# Patient Record
Sex: Female | Born: 1985 | Race: White | Hispanic: No | Smoking: Former smoker
Health system: Southern US, Community
[De-identification: ages and names within clinical notes are randomized; demographics above are authoritative.]

## PROBLEM LIST (undated history)

## (undated) DIAGNOSIS — K219 Gastro-esophageal reflux disease without esophagitis: Secondary | ICD-10-CM

## (undated) DIAGNOSIS — F439 Reaction to severe stress, unspecified: Secondary | ICD-10-CM

## (undated) DIAGNOSIS — F419 Anxiety disorder, unspecified: Secondary | ICD-10-CM

## (undated) DIAGNOSIS — Z862 Personal history of diseases of the blood and blood-forming organs and certain disorders involving the immune mechanism: Secondary | ICD-10-CM

## (undated) HISTORY — DX: Reaction to severe stress, unspecified: F43.9

---

## 2011-05-25 ENCOUNTER — Encounter (HOSPITAL_COMMUNITY): Payer: Self-pay

## 2011-05-25 ENCOUNTER — Emergency Department (HOSPITAL_COMMUNITY)
Admission: EM | Admit: 2011-05-25 | Discharge: 2011-05-25 | Disposition: A | Discharge status: Home / Self Care | Payer: Self-pay | Attending: Emergency Medicine | Admitting: Emergency Medicine

## 2011-05-25 DIAGNOSIS — L03319 Cellulitis of trunk, unspecified: Secondary | ICD-10-CM | POA: Insufficient documentation

## 2011-05-25 DIAGNOSIS — R000 Tachycardia, unspecified: Secondary | ICD-10-CM | POA: Insufficient documentation

## 2011-05-25 DIAGNOSIS — R109 Unspecified abdominal pain: Secondary | ICD-10-CM | POA: Insufficient documentation

## 2011-05-25 DIAGNOSIS — O909 Complication of the puerperium, unspecified: Secondary | ICD-10-CM | POA: Insufficient documentation

## 2011-05-25 DIAGNOSIS — L02219 Cutaneous abscess of trunk, unspecified: Secondary | ICD-10-CM | POA: Insufficient documentation

## 2011-05-25 DIAGNOSIS — L0291 Cutaneous abscess, unspecified: Secondary | ICD-10-CM

## 2011-05-25 MED ORDER — DOXYCYCLINE HYCLATE 100 MG PO CAPS: 100.0000 mg | ORAL_CAPSULE | Freq: Two times a day (BID) | ORAL | Status: AC

## 2011-05-25 MED ORDER — HYDROCODONE-ACETAMINOPHEN 5-325 MG PO TABS: 1.0000 | ORAL_TABLET | Freq: Four times a day (QID) | ORAL | Status: AC | PRN

## 2011-05-25 NOTE — ED Notes (Addendum)
Pt presents with spot on c-section scar that is warm and extremely painful since last night. Pt states spot was draining last night.

## 2011-05-25 NOTE — ED Provider Notes (Signed)
History     CSN: [id number]  Arrival date & time 05/25/11  1702   First MD Initiated Contact with Patient 05/25/11 1817      Chief Complaint  Patient presents with  . Wound Infection    (Consider location/radiation/quality/duration/timing/severity/associated sxs/prior treatment) The history is provided by the patient and the spouse.  patient is a 26 year old female status post C-section a few months ago for the delivery of her most recent child. Starting last evening that area got along the scar of the C-section Painful started draining a purulent material. No fever no nausea no vomiting. He states the pain is 10 out of 10. Feels sharp and tender in nature. No history of any problem with the C-section scar until now.  History reviewed. No pertinent past medical history.  Past Surgical History  Procedure Date  . Cesarean section     No family history on file.  History  Substance Use Topics  . Smoking status: Current Everyday Smoker -- 0.5 packs/day  . Smokeless tobacco: Not on file  . Alcohol Use: No    OB History    Grav Para Term Preterm Abortions TAB SAB Ect Mult Living                  Review of Systems  Constitutional: Negative for fever.  HENT: Negative for congestion, neck pain and neck stiffness.   Eyes: Negative for redness.  Respiratory: Negative for shortness of breath.   Cardiovascular: Negative for chest pain.  Gastrointestinal: Positive for abdominal pain. Negative for nausea, vomiting and diarrhea.  Genitourinary: Negative for dysuria.  Musculoskeletal: Negative for back pain.  Skin: Positive for wound. Negative for rash.  Neurological: Negative for headaches.  Hematological: Does not bruise/bleed easily.    Allergies  Penicillins  Home Medications   Current Outpatient Rx  Name Route Sig Dispense Refill  . DOXYCYCLINE HYCLATE 100 MG PO CAPS Oral Take 1 capsule (100 mg total) by mouth 2 (two) times daily. 14 capsule 0  .  HYDROCODONE-ACETAMINOPHEN 5-325 MG PO TABS Oral Take 1-2 tablets by mouth every 6 (six) hours as needed for pain. 10 tablet 0    BP 123/94  Pulse 128  Temp(Src) 98.2 F (36.8 C) (Oral)  Resp 20  Ht 5\' 1"  (1.549 m)  Wt 109 lb (49.442 kg)  BMI 20.60 kg/m2  SpO2 100%  Physical Exam  Nursing note and vitals reviewed. Constitutional: She is oriented to person, place, and time. She appears well-developed and well-nourished. No distress.  HENT:  Head: Normocephalic and atraumatic.  Mouth/Throat: Oropharynx is clear and moist.  Eyes: Conjunctivae and EOM are normal. Pupils are equal, round, and reactive to light.  Neck: Normal range of motion. Neck supple.  Cardiovascular: Normal rate, regular rhythm and normal heart sounds.   No murmur heard.      tachycardic  Pulmonary/Chest: Effort normal and breath sounds normal. No respiratory distress.  Abdominal: Soft. Bowel sounds are normal. There is tenderness.       Normal except for C-section scar which is well-healed except for in the center of is about a 5 mm area of erythema induration with a head of purulent material was sudden slight discharge. No significant abdominal surrounding tenderness or erythema no major area of fluctuance.  Musculoskeletal: Normal range of motion.  Neurological: She is alert and oriented to person, place, and time. No cranial nerve deficit. She exhibits normal muscle tone. Coordination normal.  Skin: Skin is warm. There is erythema.  ED Course  Procedures (including critical care time)  Labs Reviewed - No data to display No results found.   1. Abscess       MDM   Patient status post C-section for delivery of her most recent child a few months ago. During last night patient noticed a bump redness and soreness along the scar started draining some purulent material last night. Examination today shows about a 5 mm small abscess along the scar of the C-section was small had a purulent material about 5 mm  of induration no distinct fluctuance minimal erythema really not spent spreading beyond 5 mm.  We'll treat her doxycycline. She will return if the abscess gets bigger or worse it is already draining some purulent material suspect it will continue do that and he'll final stone with some antibiotic support.        Shelda Jakes, MD 05/25/11 2010

## 2011-07-10 ENCOUNTER — Encounter (HOSPITAL_COMMUNITY): Payer: Self-pay | Admitting: *Deleted

## 2011-07-10 ENCOUNTER — Emergency Department (HOSPITAL_COMMUNITY)
Admission: EM | Admit: 2011-07-10 | Discharge: 2011-07-10 | Disposition: A | Discharge status: Home / Self Care | Payer: Self-pay | Attending: Emergency Medicine | Admitting: Emergency Medicine

## 2011-07-10 DIAGNOSIS — J039 Acute tonsillitis, unspecified: Secondary | ICD-10-CM | POA: Insufficient documentation

## 2011-07-10 DIAGNOSIS — F17200 Nicotine dependence, unspecified, uncomplicated: Secondary | ICD-10-CM | POA: Insufficient documentation

## 2011-07-10 DIAGNOSIS — Z880 Allergy status to penicillin: Secondary | ICD-10-CM | POA: Insufficient documentation

## 2011-07-10 LAB — RAPID STREP SCREEN (MED CTR MEBANE ONLY): Streptococcus, Group A Screen (Direct): NEGATIVE

## 2011-07-10 MED ORDER — IBUPROFEN 600 MG PO TABS: 600.0000 mg | ORAL_TABLET | Freq: Four times a day (QID) | ORAL | Status: AC | PRN

## 2011-07-10 MED ORDER — IBUPROFEN 800 MG PO TABS
800.0000 mg | ORAL_TABLET | Freq: Once | ORAL | Status: AC
  Administered 2011-07-10: 800 mg via ORAL
  Filled 2011-07-10: qty 1

## 2011-07-10 MED ORDER — AZITHROMYCIN 250 MG PO TABS: 250.0000 mg | ORAL_TABLET | Freq: Every day | ORAL | Status: AC

## 2011-07-10 MED ORDER — AZITHROMYCIN 250 MG PO TABS
500.0000 mg | ORAL_TABLET | Freq: Once | ORAL | Status: AC
  Administered 2011-07-10: 500 mg via ORAL
  Filled 2011-07-10: qty 2

## 2011-07-10 NOTE — ED Notes (Signed)
Pt presents to Ed with c/o bilateral earaches. Pt's throat and tonsils are swollen/redened. Throat cultured by EDP. Sample sent to lab. Results pending. Pt denies fever/cough, and URI symptoms.

## 2011-07-10 NOTE — ED Provider Notes (Signed)
Medical screening examination/treatment/procedure(s) were performed by non-physician practitioner and as supervising physician I was immediately available for consultation/collaboration.   Dameion Briles, MD 07/10/11 2211 

## 2011-07-10 NOTE — ED Notes (Signed)
Bilateral ear ache

## 2011-07-10 NOTE — Discharge Instructions (Signed)
Tonsillitis Tonsils are lumps of lymphoid tissues at the back of the throat. Each tonsil has 20 crevices (crypts). Tonsils help fight nose and throat infections and keep infection from spreading to other parts of the body for the first 18 months of life. Tonsillitis is an infection of the throat that causes the tonsils to become red, tender, and swollen. CAUSES Sudden and, if treated, temporary (acute) tonsillitis is usually caused by infection with streptococcal bacteria. Long lasting (chronic) tonsillitis occurs when the crypts of the tonsils become filled with pieces of food and bacteria, which makes it easy for the tonsils to become constantly infected. SYMPTOMS  Symptoms of tonsillitis include:  A sore throat.   White patches on the tonsils.   Fever.   Tiredness.  DIAGNOSIS Tonsillitis can be diagnosed through a physical exam. Diagnosis can be confirmed with the results of lab tests, including a throat culture. TREATMENT  The goals of tonsillitis treatment include the reduction of the severity and duration of symptoms, prevention of associated conditions, and prevention of disease transmission. Tonsillitis caused by bacteria can be treated with antibiotics. Usually, treatment with antibiotics is started before the cause of the tonsillitis is known. However, if it is determined that the cause is not bacterial, antibiotics will not treat the tonsillitis. If attacks of tonsillitis are severe and frequent, your caregiver may recommend surgery to remove the tonsils (tonsillectomy). HOME CARE INSTRUCTIONS   Rest as much as possible and get plenty of sleep.   Drink plenty of fluids. While the throat is very sore, eat soft foods or liquids, such as sherbet, soups, or instant breakfast drinks.   Eat frozen ice pops.   Older children and adults may gargle with a warm or cold liquid to help soothe the throat. Mix 1 teaspoon of salt in 1 cup of water.   Other family members who also develop a  sore throat or fever should have a medical exam or throat culture.   Only take over-the-counter or prescription medicines for pain, discomfort, or fever as directed by your caregiver.   If you are given antibiotics, take them as directed. Finish them even if you start to feel better.  SEEK MEDICAL CARE IF:   Your baby is older than 3 months with a rectal temperature of 100.5 F (38.1 C) or higher for more than 1 day.   Large, tender lumps develop in your neck.   A rash develops.   Green, yellow-brown, or bloody substance is coughed up.   You are unable to swallow liquids or food for 24 hours.   Your child is unable to swallow food or liquids for 12 hours.  SEEK IMMEDIATE MEDICAL CARE IF:   You develop any new symptoms such as vomiting, severe headache, stiff neck, chest pain, or trouble breathing or swallowing.   You have severe throat pain along with drooling or voice changes.   You have severe pain, unrelieved with recommended medications.   You are unable to fully open the mouth.   You develop redness, swelling, or severe pain anywhere in the neck.   You have a fever.   Your baby is older than 3 months with a rectal temperature of 102 F (38.9 C) or higher.   Your baby is 12 months old or younger with a rectal temperature of 100.4 F (38 C) or higher.  MAKE SURE YOU:   Understand these instructions.   Will watch your condition.   Will get help right away if you are not  watch your condition.   Will get help right away if you are not doing well or get worse.  Document Released: 01/30/2005 Document Revised: 04/11/2011 Document Reviewed: 06/28/2010  [employer] Patient Information 2012 [employer].

## 2011-07-10 NOTE — ED Provider Notes (Signed)
History     CSN: [id number]  Arrival date & time 07/10/11  2058   First MD Initiated Contact with Patient 07/10/11 2104      Chief Complaint  Patient presents with  . Otalgia    (Consider location/radiation/quality/duration/timing/severity/associated sxs/prior treatment) Patient is a 25 y.o. female presenting with ear pain. The history is provided by the patient.  Otalgia This is a new problem. The current episode started more than 2 days ago. There is pain in both (Pain started in her right ear,  is also hurting in her right ear.  She denies drainage,  denies fever.) ears. The problem occurs constantly. The problem has been gradually worsening. There has been no fever. The pain is at a severity of 9/10. The pain is severe. Associated symptoms include sore throat. Pertinent negatives include no ear discharge, no headaches, no hearing loss, no rhinorrhea, no abdominal pain, no neck pain, no cough and no rash. Her past medical history does not include chronic ear infection.    History reviewed. No pertinent past medical history.  Past Surgical History  Procedure Date  . Cesarean section     No family history on file.  History  Substance Use Topics  . Smoking status: Current Everyday Smoker -- 0.5 packs/day  . Smokeless tobacco: Not on file  . Alcohol Use: No    OB History    Grav Para Term Preterm Abortions TAB SAB Ect Mult Living                  Review of Systems  Constitutional: Negative for fever.  HENT: Positive for ear pain and sore throat. Negative for hearing loss, congestion, rhinorrhea, neck pain and ear discharge.   Eyes: Negative.   Respiratory: Negative for cough, chest tightness and shortness of breath.   Cardiovascular: Negative for chest pain and palpitations.  Gastrointestinal: Negative for nausea and abdominal pain.  Genitourinary: Negative.   Musculoskeletal: Negative for joint swelling and arthralgias.  Skin: Negative.  Negative for rash and wound.    Neurological: Negative for dizziness, weakness, light-headedness, numbness and headaches.  Hematological: Negative.   Psychiatric/Behavioral: Negative.     Allergies  Penicillins  Home Medications   Current Outpatient Rx  Name Route Sig Dispense Refill  . LEVONORGESTREL 20 MCG/24HR IU IUD Intrauterine 1 each by Intrauterine route once.    . AZITHROMYCIN 250 MG PO TABS Oral Take 1 tablet (250 mg total) by mouth daily. One tablet PO daily for 4 days 4 tablet 0  . IBUPROFEN 600 MG PO TABS Oral Take 1 tablet (600 mg total) by mouth every 6 (six) hours as needed for pain. 20 tablet 0    BP 132/89  Pulse 129  Temp(Src) 97.7 F (36.5 C) (Oral)  Resp 20  Ht 5\' 1"  (1.549 m)  Wt 115 lb (52.164 kg)  BMI 21.73 kg/m2  SpO2 100%  LMP 07/10/2011  Physical Exam  Nursing note and vitals reviewed. Constitutional: She is oriented to person, place, and time. She appears well-developed and well-nourished.  HENT:  Head: Normocephalic and atraumatic.  Right Ear: Tympanic membrane and external ear normal.  Left Ear: Tympanic membrane and external ear normal.  Nose: Nose normal.  Mouth/Throat: Uvula is midline and mucous membranes are normal. Posterior oropharyngeal erythema present. No oropharyngeal exudate, posterior oropharyngeal edema or tonsillar abscesses.       Moderate bilateral tonsillar hypertrophy,  Right greater than left,  But not touching uvula.  Eyes: Conjunctivae are normal.  Neck: Normal  range of motion. No mass and no thyromegaly present.  Cardiovascular: Normal rate, regular rhythm, normal heart sounds and intact distal pulses.   Pulmonary/Chest: Effort normal and breath sounds normal. She has no wheezes.  Abdominal: Soft. Bowel sounds are normal. There is no tenderness.  Musculoskeletal: Normal range of motion.  Lymphadenopathy:       Head (right side): Tonsillar adenopathy present.  Neurological: She is alert and oriented to person, place, and time.  Skin: Skin is warm  and dry.  Psychiatric: She has a normal mood and affect.    ED Course  Procedures (including critical care time)   Labs Reviewed  RAPID STREP SCREEN   No results found.   1. Tonsillitis       MDM  Zithromax,  Ibuprofen.  Recheck of pulse rate at dc 104.  Discussed elevated pulse rate.  Patient states her pulse is always elevated,  Generally stays around 100-110.  Denies sob,  Dizziness,  Palpitations.  Referral to Dr Ouida Sills to establish primary medical care.        Candis Musa, PA 07/10/11 2201  Candis Musa, PA 07/10/11 2202

## 2011-11-05 ENCOUNTER — Encounter (HOSPITAL_COMMUNITY): Payer: Self-pay | Admitting: *Deleted

## 2011-11-05 ENCOUNTER — Emergency Department (HOSPITAL_COMMUNITY)
Admission: EM | Admit: 2011-11-05 | Discharge: 2011-11-05 | Disposition: A | Discharge status: Home / Self Care | Payer: Self-pay | Attending: Emergency Medicine | Admitting: Emergency Medicine

## 2011-11-05 DIAGNOSIS — R1011 Right upper quadrant pain: Secondary | ICD-10-CM | POA: Insufficient documentation

## 2011-11-05 DIAGNOSIS — R109 Unspecified abdominal pain: Secondary | ICD-10-CM

## 2011-11-05 HISTORY — DX: Gastro-esophageal reflux disease without esophagitis: K21.9

## 2011-11-05 HISTORY — DX: Anxiety disorder, unspecified: F41.9

## 2011-11-05 LAB — CBC WITH DIFFERENTIAL/PLATELET
Basophils Absolute: 0 10*3/uL (ref 0.0–0.1)
Lymphocytes Relative: 39 % (ref 12–46)
Lymphs Abs: 2.1 10*3/uL (ref 0.7–4.0)
Neutro Abs: 2.8 10*3/uL (ref 1.7–7.7)
Neutrophils Relative %: 52 % (ref 43–77)
Platelets: 196 10*3/uL (ref 150–400)
RBC: 4.58 MIL/uL (ref 3.87–5.11)
RDW: 12.5 % (ref 11.5–15.5)
WBC: 5.4 10*3/uL (ref 4.0–10.5)

## 2011-11-05 LAB — PREGNANCY, URINE: Preg Test, Ur: NEGATIVE

## 2011-11-05 LAB — COMPREHENSIVE METABOLIC PANEL
ALT: 8 U/L (ref 0–35)
AST: 13 U/L (ref 0–37)
Alkaline Phosphatase: 54 U/L (ref 39–117)
CO2: 27 mEq/L (ref 19–32)
Calcium: 10.1 mg/dL (ref 8.4–10.5)
Chloride: 100 mEq/L (ref 96–112)
GFR calc non Af Amer: >90 mL/min (ref >90)
Potassium: 3.6 mEq/L (ref 3.5–5.1)
Sodium: 136 mEq/L (ref 135–145)
Total Bilirubin: 0.2 mg/dL — ABNORMAL LOW (ref 0.3–1.2)

## 2011-11-05 LAB — URINALYSIS, ROUTINE W REFLEX MICROSCOPIC
Hgb urine dipstick: NEGATIVE
Nitrite: NEGATIVE
Specific Gravity, Urine: 1.02 (ref 1.005–1.030)
Urobilinogen, UA: 0.2 mg/dL (ref 0.0–1.0)
pH: 6.5 (ref 5.0–8.0)

## 2011-11-05 MED ORDER — FAMOTIDINE IN NACL 20-0.9 MG/50ML-% IV SOLN
20.0000 mg | Freq: Once | INTRAVENOUS | Status: AC
  Administered 2011-11-05: 20 mg via INTRAVENOUS
  Filled 2011-11-05: qty 50

## 2011-11-05 MED ORDER — ONDANSETRON HCL 4 MG/2ML IJ SOLN
4.0000 mg | Freq: Once | INTRAMUSCULAR | Status: AC
  Administered 2011-11-05: 4 mg via INTRAVENOUS
  Filled 2011-11-05: qty 2

## 2011-11-05 MED ORDER — FAMOTIDINE 20 MG PO TABS: 20.0000 mg | ORAL_TABLET | Freq: Two times a day (BID) | ORAL | Status: DC

## 2011-11-05 MED ORDER — SODIUM CHLORIDE 0.9 % IV SOLN
Freq: Once | INTRAVENOUS | Status: AC
  Administered 2011-11-05: 1000 mL via INTRAVENOUS

## 2011-11-05 MED ORDER — PROMETHAZINE HCL 25 MG PO TABS: 25.0000 mg | ORAL_TABLET | Freq: Four times a day (QID) | ORAL | Status: DC | PRN

## 2011-11-05 NOTE — ED Notes (Signed)
States that food including toast makes her nauseated, states that only thing that does not make her nauseated is instant potatoes, significant other states that pt has lost 25 lbs over about 3 months since her emergency c-section done, c/o abd pain, denies vaginal d/c or burning on urination, last BM was 30 minutes ago per pt and was normal per pt

## 2011-11-05 NOTE — ED Notes (Signed)
Pt reports feeling sharp pain in stomach about 4 days ago, sharp pain in back about 2 days ago, and today pain moved from back to front.  Pt reports occasional nausea after eating.

## 2011-11-05 NOTE — ED Provider Notes (Signed)
Screening evaluation: She presents with right flank radiating to groin pain, intermittently for several days. She denies dysuria, or urinary frequency. She's had no vaginal discharge, or bleeding. She does not know when her last pregnancy was. Her vital signs are normal and she appears comfortable, but anxious. She has had similar symptoms many times since her child was born by emergency C-section about one year ago. Urine  testing has been ordered.    Flint Melter, MD 11/05/11 2145

## 2011-11-05 NOTE — ED Provider Notes (Signed)
History     CSN: [id number]  Arrival date & time 11/05/11  2050   First MD Initiated Contact with Patient 11/05/11 2149      Chief Complaint  Patient presents with  . Abdominal Pain    (Consider location/radiation/quality/duration/timing/severity/associated sxs/prior treatment) HPI Comments: Patient c/o intermittent RUQ and epigastric pain since child birth approximately one year ago.  States the pain has become worse for one week.  States the pain is worse with eating certain foods.  She denies vomiting, fever, urinary sx's or vaginal pain or discharge.  States the pain occasionally improves with antiacids.  States she has seen her PMD for this, but states she was told that "they couldn't find anything wrong".    Patient is a 26 y.o. female presenting with abdominal pain. The history is provided by the patient.  Abdominal Pain The primary symptoms of the illness include abdominal pain and nausea. The primary symptoms of the illness do not include fever, shortness of breath, vomiting, diarrhea, hematemesis, dysuria, vaginal discharge or vaginal bleeding. The current episode started more than 2 days ago. The onset of the illness was gradual. Progression since onset: waxing and waning.  The abdominal pain began more than 2 days ago. The abdominal pain is located in the epigastric region. The abdominal pain radiates to the RUQ and epigastric region. The abdominal pain is relieved by nothing. Exacerbated by: eating.  The patient states that she believes she is currently not pregnant. The patient has not had a change in bowel habit. Additional symptoms associated with the illness include heartburn and back pain. Symptoms associated with the illness do not include chills, anorexia, constipation, urgency, hematuria or frequency.    Past Medical History  Diagnosis Date  . GERD (gastroesophageal reflux disease)   . Anxiety     Past Surgical History  Procedure Date  . Cesarean section      History reviewed. No pertinent family history.  History  Substance Use Topics  . Smoking status: Current Everyday Smoker -- 0.5 packs/day  . Smokeless tobacco: Not on file  . Alcohol Use: No    OB History    Grav Para Term Preterm Abortions TAB SAB Ect Mult Living                  Review of Systems  Constitutional: Positive for appetite change. Negative for fever, chills and activity change.  HENT: Negative for neck pain and neck stiffness.   Respiratory: Negative for chest tightness and shortness of breath.   Cardiovascular: Negative for chest pain.  Gastrointestinal: Positive for heartburn, nausea and abdominal pain. Negative for vomiting, diarrhea, constipation, blood in stool, abdominal distention, anorexia and hematemesis.  Genitourinary: Negative for dysuria, urgency, frequency, hematuria, decreased urine volume, vaginal bleeding, vaginal discharge and difficulty urinating.  Musculoskeletal: Positive for back pain.  Neurological: Negative for headaches.  All other systems reviewed and are negative.    Allergies  Bee venom and Penicillins  Home Medications   Current Outpatient Rx  Name Route Sig Dispense Refill  . LEVONORGESTREL 20 MCG/24HR IU IUD Intrauterine 1 each by Intrauterine route once.      BP 117/78  Pulse 106  Temp 97.9 F (36.6 C)  Resp 18  Ht 5\' 1"  (1.549 m)  Wt 110 lb (49.896 kg)  BMI 20.78 kg/m2  SpO2 100%  Physical Exam  Nursing note and vitals reviewed. Constitutional: She is oriented to person, place, and time. She appears well-developed and well-nourished. No distress.  HENT:  Head: Normocephalic and atraumatic.  Mouth/Throat: Oropharynx is clear and moist.  Neck: Normal range of motion. Neck supple.  Cardiovascular: Normal rate, regular rhythm, normal heart sounds and intact distal pulses.   No murmur heard. Pulmonary/Chest: Effort normal and breath sounds normal.  Abdominal: Soft. Bowel sounds are normal. She exhibits no  distension and no mass. There is tenderness in the epigastric area. There is no rebound, no guarding, no CVA tenderness and no tenderness at McBurney's point.       Mild epigastric tenderness, no guarding or rebound tenderness  Musculoskeletal: She exhibits no edema.  Lymphadenopathy:    She has no cervical adenopathy.  Neurological: She is alert and oriented to person, place, and time.  Skin: Skin is warm and dry.    ED Course  Procedures (including critical care time)  Results for orders placed during the hospital encounter of 11/05/11  URINALYSIS, ROUTINE W REFLEX MICROSCOPIC      Component Value Range   Color, Urine AMBER (*) YELLOW   APPearance CLEAR  CLEAR   Specific Gravity, Urine 1.020  1.005 - 1.030   pH 6.5  5.0 - 8.0   Glucose, UA NEGATIVE  NEGATIVE mg/dL   Hgb urine dipstick NEGATIVE  NEGATIVE   Bilirubin Urine NEGATIVE  NEGATIVE   Ketones, ur NEGATIVE  NEGATIVE mg/dL   Protein, ur NEGATIVE  NEGATIVE mg/dL   Urobilinogen, UA 0.2  0.0 - 1.0 mg/dL   Nitrite NEGATIVE  NEGATIVE   Leukocytes, UA NEGATIVE  NEGATIVE  PREGNANCY, URINE      Component Value Range   Preg Test, Ur NEGATIVE  NEGATIVE  CBC WITH DIFFERENTIAL      Component Value Range   WBC 5.4  4.0 - 10.5 K/uL   RBC 4.58  3.87 - 5.11 MIL/uL   Hemoglobin 13.4  12.0 - 15.0 g/dL   HCT 16.1  09.6 - 04.5 %   MCV 87.3  78.0 - 100.0 fL   MCH 29.3  26.0 - 34.0 pg   MCHC 33.5  30.0 - 36.0 g/dL   RDW 40.9  81.1 - 91.4 %   Platelets 196  150 - 400 K/uL   Neutrophils Relative 52  43 - 77 %   Neutro Abs 2.8  1.7 - 7.7 K/uL   Lymphocytes Relative 39  12 - 46 %   Lymphs Abs 2.1  0.7 - 4.0 K/uL   Monocytes Relative 8  3 - 12 %   Monocytes Absolute 0.4  0.1 - 1.0 K/uL   Eosinophils Relative 1  0 - 5 %   Eosinophils Absolute 0.1  0.0 - 0.7 K/uL   Basophils Relative 0  0 - 1 %   Basophils Absolute 0.0  0.0 - 0.1 K/uL  COMPREHENSIVE METABOLIC PANEL      Component Value Range   Sodium 136  135 - 145 mEq/L   Potassium  3.6  3.5 - 5.1 mEq/L   Chloride 100  96 - 112 mEq/L   CO2 27  19 - 32 mEq/L   Glucose, Bld 89  70 - 99 mg/dL   BUN 14  6 - 23 mg/dL   Creatinine, Ser 7.82  0.50 - 1.10 mg/dL   Calcium 95.6  8.4 - 21.3 mg/dL   Total Protein 7.3  6.0 - 8.3 g/dL   Albumin 4.4  3.5 - 5.2 g/dL   AST 13  0 - 37 U/L   ALT 8  0 - 35 U/L   Alkaline Phosphatase 54  39 - 117 U/L   Total Bilirubin 0.2 (*) 0.3 - 1.2 mg/dL   GFR calc non Af Amer >90  >90 mL/min   GFR calc Af Amer >90  >90 mL/min  LIPASE, BLOOD      Component Value Range   Lipase 51  11 - 59 U/L         MDM    Patient is feeling better after Zofran and Pepcid. States pain has improved and she is requesting to go home.  Abdomen remains soft with mild tenderness to the epigastric region. No guarding, McBurney's point tenderness or peritoneal signs. Clinically, symptoms are likely related to cholelithiasis. I do not suspect a surgical abdomen at this time.   Vitals stable, nursing notes reviewed, labs interrupted, discussed with pt.  I have discussed imaging with the patient and she prefers to return in the morning for an ultrasound. I will arrange for oral outpatient ultrasound to be done in the morning. Appears stable for discharge, I discussed patient's history results and care plan with the EDP.  Prescribed: Phenergan  pepcid   Patient / Family / Caregiver understand and agree with initial ED impression and plan with expectations set for ED visit. Pt stable in ED with no significant deterioration in condition. Pt feels improved after observation and/or treatment in ED.    Simra Fiebig L. Kayleb Warshaw, [place] 11/11/11 1659

## 2011-11-06 ENCOUNTER — Other Ambulatory Visit (HOSPITAL_COMMUNITY): Payer: Self-pay | Admitting: Emergency Medicine

## 2011-11-06 ENCOUNTER — Ambulatory Visit (HOSPITAL_COMMUNITY)
Admit: 2011-11-06 | Discharge: 2011-11-06 | Disposition: A | Discharge status: Home / Self Care | Payer: Self-pay | Attending: *Deleted | Admitting: *Deleted

## 2011-11-06 DIAGNOSIS — R1011 Right upper quadrant pain: Secondary | ICD-10-CM | POA: Insufficient documentation

## 2011-11-06 NOTE — ED Provider Notes (Signed)
Pt husband given [place] results, states she was referred.  No information on file. *RADIOLOGY REPORT*  Clinical Data: Right upper quadrant abdominal pain.  LIMITED ABDOMINAL ULTRASOUND - RIGHT UPPER QUADRANT  Comparison: No priors.  Findings:  Gallbladder: No shadowing gallstones or echogenic sludge. No  gallbladder wall thickening or pericholecystic fluid. Negative  sonographic Murphy's sign according to the ultrasound technologist.  Common bile duct: Normal caliber measuring 5.4 mm within the porta  hepatis.  Liver: Normal size and echotexture without focal parenchymal  abnormality. Patent portal vein with hepatopetal flow.  IMPRESSION:  Unremarkable right upper quadrant abdominal ultrasound.  Specifically, no evidence of cholelithiasis or findings to suggest  acute cholecystitis at this time.  Original Report Authenticated By: Florencia Reasons, M.D.        Ward Givens, MD 11/06/11 [id number]

## 2011-11-11 NOTE — ED Provider Notes (Signed)
Medical screening examination/treatment/procedure(s) were performed by non-physician practitioner and as supervising physician I was immediately available for consultation/collaboration.   Laray Anger, DO 11/11/11 1954

## 2011-11-15 ENCOUNTER — Emergency Department (HOSPITAL_COMMUNITY)
Admission: EM | Admit: 2011-11-15 | Discharge: 2011-11-15 | Discharge status: Against Medical Advice | Payer: Self-pay | Attending: Emergency Medicine | Admitting: Emergency Medicine

## 2011-11-15 ENCOUNTER — Encounter (HOSPITAL_COMMUNITY): Payer: Self-pay

## 2011-11-15 DIAGNOSIS — S61259A Open bite of unspecified finger without damage to nail, initial encounter: Secondary | ICD-10-CM

## 2011-11-15 DIAGNOSIS — F17200 Nicotine dependence, unspecified, uncomplicated: Secondary | ICD-10-CM | POA: Insufficient documentation

## 2011-11-15 DIAGNOSIS — S60229A Contusion of unspecified hand, initial encounter: Secondary | ICD-10-CM | POA: Insufficient documentation

## 2011-11-15 DIAGNOSIS — K219 Gastro-esophageal reflux disease without esophagitis: Secondary | ICD-10-CM | POA: Insufficient documentation

## 2011-11-15 DIAGNOSIS — T148 Other injury of unspecified body region: Secondary | ICD-10-CM | POA: Insufficient documentation

## 2011-11-15 DIAGNOSIS — W5581XA Bitten by other mammals, initial encounter: Secondary | ICD-10-CM | POA: Insufficient documentation

## 2011-11-15 DIAGNOSIS — Y92009 Unspecified place in unspecified non-institutional (private) residence as the place of occurrence of the external cause: Secondary | ICD-10-CM | POA: Insufficient documentation

## 2011-11-15 MED ORDER — OXYCODONE-ACETAMINOPHEN 5-325 MG PO TABS
2.0000 | ORAL_TABLET | Freq: Once | ORAL | Status: AC
  Administered 2011-11-15: 2 via ORAL
  Filled 2011-11-15: qty 2

## 2011-11-15 NOTE — ED Notes (Signed)
Poison control called regarding possible bite. Will fax info on bite

## 2011-11-15 NOTE — ED Notes (Signed)
Area to hand cleaned no obvious puncture wounds noted or swelling.

## 2011-11-15 NOTE — ED Notes (Signed)
Possible snake bite to right index and middle finger

## 2011-11-15 NOTE — ED Notes (Signed)
Pt states that she is not going to wait any longer and refuses to stay. Pt encouraged to stay but declined. No swelling noted to hand.

## 2011-11-15 NOTE — ED Provider Notes (Signed)
History     CSN: [id number]  Arrival date & time 11/15/11  1348   First MD Initiated Contact with Patient 11/15/11 1410      Chief Complaint  Patient presents with  . Snake Bite    HPI Pt was seen at 1420.  Per pt and family, c/o sudden onset and resolution of one episode of "getting bit by something" while she was outside in the yard approx 1330 PTA.  Pt states she noticed 2 small puncture wounds on her right dorsal hand index and middle fingers.  States she did not see what she was injured by, but is concerned it was a snake.  Denies any other injuries.  Denies focal motor weakness, no tingling/numbness in extremities, no rash, no edema, no ecchymosis, no fevers, no AMS, no N/V/D, no abd pain, no CP/SOB.      Past Medical History  Diagnosis Date  . GERD (gastroesophageal reflux disease)   . Anxiety     Past Surgical History  Procedure Date  . Cesarean section     History  Substance Use Topics  . Smoking status: Current Everyday Smoker -- 0.5 packs/day  . Smokeless tobacco: Not on file  . Alcohol Use: No    Review of Systems ROS: Statement: All systems negative except as marked or noted in the HPI; Constitutional: Negative for fever and chills. ; ; Eyes: Negative for eye pain, redness and discharge. ; ; ENMT: Negative for ear pain, hoarseness, nasal congestion, sinus pressure and sore throat. ; ; Cardiovascular: Negative for chest pain, palpitations, diaphoresis, dyspnea and peripheral edema. ; ; Respiratory: Negative for cough, wheezing and stridor. ; ; Gastrointestinal: Negative for nausea, vomiting, diarrhea, abdominal pain, blood in stool, hematemesis, jaundice and rectal bleeding. . ; ; Genitourinary: Negative for dysuria, flank pain and hematuria. ; ; Musculoskeletal: Negative for back pain and neck pain. Negative for swelling and trauma.; ; Skin: Negative for pruritus, rash, blisters, bruising and +skin lesion.; ; Neuro: Negative for headache, lightheadedness and neck  stiffness. Negative for weakness, altered level of consciousness , altered mental status, extremity weakness, paresthesias, involuntary movement, seizure and syncope.      Allergies  Bee venom and Penicillins  Home Medications   Current Outpatient Rx  Name Route Sig Dispense Refill  . FAMOTIDINE 20 MG PO TABS Oral Take 1 tablet (20 mg total) by mouth 2 (two) times daily. 30 tablet 0  . LEVONORGESTREL 20 MCG/24HR IU IUD Intrauterine 1 each by Intrauterine route once.    Marland Kitchen PROMETHAZINE HCL 25 MG PO TABS Oral Take 1 tablet (25 mg total) by mouth every 6 (six) hours as needed for nausea. 12 tablet 0    BP 129/86  Pulse 111  Temp 98.4 F (36.9 C) (Oral)  Ht 5\' 1"  (1.549 m)  Wt 115 lb (52.164 kg)  BMI 21.73 kg/m2  SpO2 100%  Physical Exam 1425: Physical examination:  Nursing notes reviewed; Vital signs and O2 SAT reviewed;  Constitutional: Well developed, Well nourished, Well hydrated, In no acute distress; Head:  Normocephalic, atraumatic; Eyes: EOMI, PERRL, No scleral icterus; ENMT: Mouth and pharynx normal, Mucous membranes moist; Neck: Supple, Full range of motion, No lymphadenopathy; Cardiovascular: Regular rate and rhythm, No murmur, rub, or gallop; Respiratory: Breath sounds clear & equal bilaterally, No rales, rhonchi, wheezes.  Speaking full sentences with ease, Normal respiratory effort/excursion; Chest: Nontender, Movement normal; Abdomen: Soft, Nontender, Nondistended, Normal bowel sounds;; Extremities: Pulses normal, No tenderness, +right dorsal hand at bases of index and  middle finger, 2 punctate circular wounds without active bleeding and without surrounding edema, no erythema, no ecchymosis.  No calf edema or asymmetry.; Neuro: AA&Ox3, Major CN grossly intact.  Speech clear. Gait steady. No gross focal motor or sensory deficits in extremities.; Skin: Color normal, Warm, Dry.   ED Course  Procedures   MDM  MDM Reviewed: nursing note and vitals   1530:  Right hand washed  with soap/water per [facility] guidelines, no obvious wounds noted to fingers.  [facility] recommends observation x6 hours.  Pt and family aware and agreeable with plan.  1630:  No hand swelling.  More comfortable after percocet PO. Walking around ED without distress.     1800:  Pt apparently left.        Laray Anger, DO 11/16/11 1434

## 2011-11-15 NOTE — ED Notes (Addendum)
Patient denies pain and is resting comfortably. No swelling noted

## 2012-03-13 ENCOUNTER — Emergency Department (HOSPITAL_COMMUNITY): Payer: Self-pay

## 2012-03-13 ENCOUNTER — Encounter (HOSPITAL_COMMUNITY): Payer: Self-pay | Admitting: *Deleted

## 2012-03-13 ENCOUNTER — Emergency Department (HOSPITAL_COMMUNITY)
Admission: EM | Admit: 2012-03-13 | Discharge: 2012-03-13 | Disposition: A | Discharge status: Home / Self Care | Payer: Self-pay | Attending: Emergency Medicine | Admitting: Emergency Medicine

## 2012-03-13 DIAGNOSIS — M545 Low back pain, unspecified: Secondary | ICD-10-CM | POA: Insufficient documentation

## 2012-03-13 DIAGNOSIS — F17200 Nicotine dependence, unspecified, uncomplicated: Secondary | ICD-10-CM | POA: Insufficient documentation

## 2012-03-13 DIAGNOSIS — Z862 Personal history of diseases of the blood and blood-forming organs and certain disorders involving the immune mechanism: Secondary | ICD-10-CM | POA: Insufficient documentation

## 2012-03-13 DIAGNOSIS — G8929 Other chronic pain: Secondary | ICD-10-CM | POA: Insufficient documentation

## 2012-03-13 DIAGNOSIS — R110 Nausea: Secondary | ICD-10-CM | POA: Insufficient documentation

## 2012-03-13 DIAGNOSIS — Z8719 Personal history of other diseases of the digestive system: Secondary | ICD-10-CM | POA: Insufficient documentation

## 2012-03-13 DIAGNOSIS — R1011 Right upper quadrant pain: Secondary | ICD-10-CM | POA: Insufficient documentation

## 2012-03-13 DIAGNOSIS — Z8659 Personal history of other mental and behavioral disorders: Secondary | ICD-10-CM | POA: Insufficient documentation

## 2012-03-13 LAB — COMPREHENSIVE METABOLIC PANEL
Albumin: 4.2 g/dL (ref 3.5–5.2)
Alkaline Phosphatase: 52 U/L (ref 39–117)
BUN: 16 mg/dL (ref 6–23)
Calcium: 9.8 mg/dL (ref 8.4–10.5)
GFR calc Af Amer: >90 mL/min (ref >90)
Potassium: 4 mEq/L (ref 3.5–5.1)
Sodium: 133 mEq/L — ABNORMAL LOW (ref 135–145)
Total Protein: 7.3 g/dL (ref 6.0–8.3)

## 2012-03-13 LAB — CBC WITH DIFFERENTIAL/PLATELET
Basophils Absolute: 0 10*3/uL (ref 0.0–0.1)
Eosinophils Relative: 1 % (ref 0–5)
HCT: 41 % (ref 36.0–46.0)
Hemoglobin: 14 g/dL (ref 12.0–15.0)
Lymphocytes Relative: 26 % (ref 12–46)
Lymphs Abs: 2 10*3/uL (ref 0.7–4.0)
MCV: 88 fL (ref 78.0–100.0)
Monocytes Absolute: 0.5 10*3/uL (ref 0.1–1.0)
Monocytes Relative: 7 % (ref 3–12)
Neutro Abs: 5 10*3/uL (ref 1.7–7.7)
RDW: 12.2 % (ref 11.5–15.5)
WBC: 7.6 10*3/uL (ref 4.0–10.5)

## 2012-03-13 LAB — URINALYSIS, ROUTINE W REFLEX MICROSCOPIC
Bilirubin Urine: NEGATIVE
Glucose, UA: NEGATIVE mg/dL
Hgb urine dipstick: NEGATIVE
Ketones, ur: NEGATIVE mg/dL
Leukocytes, UA: NEGATIVE
Nitrite: NEGATIVE
Urobilinogen, UA: 0.2 mg/dL (ref 0.0–1.0)

## 2012-03-13 MED ORDER — HYDROMORPHONE HCL PF 1 MG/ML IJ SOLN
0.5000 mg | Freq: Once | INTRAMUSCULAR | Status: AC
  Administered 2012-03-13: 0.5 mg via INTRAVENOUS
  Filled 2012-03-13: qty 1

## 2012-03-13 MED ORDER — ONDANSETRON HCL 4 MG/2ML IJ SOLN
4.0000 mg | Freq: Once | INTRAMUSCULAR | Status: AC
  Administered 2012-03-13: 4 mg via INTRAVENOUS
  Filled 2012-03-13: qty 2

## 2012-03-13 MED ORDER — LACTATED RINGERS IV BOLUS (SEPSIS)
1000.0000 mL | Freq: Once | INTRAVENOUS | Status: AC
  Administered 2012-03-13: 1000 mL via INTRAVENOUS

## 2012-03-13 MED ORDER — HYDROCODONE-ACETAMINOPHEN 5-325 MG PO TABS: 1.0000 | ORAL_TABLET | ORAL | Status: DC | PRN

## 2012-03-13 MED ORDER — ONDANSETRON HCL 4 MG PO TABS: 4.0000 mg | ORAL_TABLET | Freq: Three times a day (TID) | ORAL | Status: DC | PRN

## 2012-03-13 NOTE — ED Provider Notes (Signed)
History     CSN: [id number]  Arrival date & time 03/13/12  1641   First MD Initiated Contact with Patient 03/13/12 1717      Chief Complaint  Patient presents with  . Abdominal Pain  . Back Pain    (Consider location/radiation/quality/duration/timing/severity/associated sxs/prior treatment) HPIAllison Tremper is a 26 y.o. female presenting with constellation of pain which starts in RUQ, shoots to right leg, associated with right shoulder pain, this is 10/10, sharp - intermittenlty, otherwise it is "dull" at baseline, has been worse with eating.  Had prior work-up for GB disease with no stones.  Pt has had nausea and anorexia without RLQ pain. LMP is unknown - currently has Mirena IUD, no vaginal DC, no dysuria, no diarrhea, no fevers, no chills chest pain or dyspnea. Denies EtOH or illicit drugs.   Past Medical History  Diagnosis Date  . GERD (gastroesophageal reflux disease)   . Anxiety   . Anemia     Past Surgical History  Procedure Date  . Cesarean section     History reviewed. No pertinent family history.  History  Substance Use Topics  . Smoking status: Current Every Day Smoker -- 0.5 packs/day    Types: Cigarettes  . Smokeless tobacco: Not on file  . Alcohol Use: No    OB History    Grav Para Term Preterm Abortions TAB SAB Ect Mult Living                  Review of Systems At least 10pt or greater review of systems completed and are negative except where specified in the HPI.  Allergies  Bee venom and Penicillins  Home Medications   Current Outpatient Rx  Name  Route  Sig  Dispense  Refill  . ACETAMINOPHEN 500 MG PO TABS   Oral   Take 1,000 mg by mouth every 6 (six) hours as needed. For pain         . LEVONORGESTREL 20 MCG/24HR IU IUD   Intrauterine   1 each by Intrauterine route once.           BP 125/80  Pulse 118  Temp 97.9 F (36.6 C) (Oral)  Resp 20  Ht 5\' 1"  (1.549 m)  Wt 105 lb (47.628 kg)  BMI 19.84 kg/m2  SpO2  100%  Physical Exam  Nursing notes reviewed.  Electronic medical record reviewed. VITAL SIGNS:   Filed Vitals:   03/13/12 1654  BP: 125/80  Pulse: 118  Temp: 97.9 F (36.6 C)  TempSrc: Oral  Resp: 20  Height: 5\' 1"  (1.549 m)  Weight: 105 lb (47.628 kg)  SpO2: 100%   CONSTITUTIONAL: Awake, oriented x4, thin body habitus, appears non-toxic HENT: Atraumatic, normocephalic, oral mucosa pink and moist, airway patent. Nares patent without drainage. External ears normal. EYES: Conjunctiva clear, EOMI, PERRLA NECK: Trachea midline, non-tender, supple CARDIOVASCULAR: Normal heart rate, Normal rhythm, No murmurs, rubs, gallops PULMONARY/CHEST: Clear to auscultation, no rhonchi, wheezes, or rales. Symmetrical breath sounds. Non-tender. ABDOMINAL: Non-distended, soft, non-tender - no rebound or guarding.  BS normal. NEUROLOGIC: Non-focal, moving all four extremities, no gross sensory or motor deficits. EXTREMITIES: No clubbing, cyanosis, or edema SKIN: Warm, Dry, No erythema, No rash  ED Course  Procedures (including critical care time)  Labs Reviewed  COMPREHENSIVE METABOLIC PANEL - Abnormal; Notable for the following:    Sodium 133 (*)     Total Bilirubin 0.2 (*)     All other components within normal limits  URINALYSIS, ROUTINE W  REFLEX MICROSCOPIC  PREGNANCY, URINE  CBC WITH DIFFERENTIAL  LIPASE, BLOOD  LAB REPORT - SCANNED   Dg Chest 2 View  03/13/2012  *RADIOLOGY REPORT*  Clinical Data: RLQ abd pain, back pain, smoker  CHEST - 2 VIEW  Comparison: None.  Findings: Cardiac and mediastinal contours appear normal.  The lungs appear clear.  No pleural effusion is identified.  IMPRESSION:  No significant abnormality identified.   Original Report Authenticated By: Gaylyn Rong, M.D.    Dg Abd 2 Views  03/13/2012  *RADIOLOGY REPORT*  Clinical Data: Right lower quadrant abdominal pain, back pain  ABDOMEN - 2 VIEW  Comparison: Chest radiograph - earlier same day  Findings:  Nonobstructive bowel gas pattern.  No pneumoperitoneum, pneumatosis or portal venous gas.  No abnormal intra-abdominal calcifications.  An IUD device overlies the pelvis.  No acute osseous abnormalities.  IMPRESSION: Nonobstructive bowel gas pattern.   Original Report Authenticated By: Tacey Ruiz, MD     1. Chronic right upper quadrant pain   2. Nausea     MDM  Avenly Roberge is a 26 y.o. female presenting with RUQ pain and nausea.  Constellation of pain is inconsistent with typical acute intra-abdominal pain.  On review of prior records, she has had a negative [place] less than 6 months prior - doubt a new stone has formed.  No concerns for obstruction or infection with labs.  Abdominal exam is benign.  No UTI.  Deferred pelvic exam d/t no vaginal complaints, no elevated WBC count and normal LFTs - doubt Fitz-Hugh Curtis syndrome.  Likewise, with normal LFTS/CBC and previous normal [place] of RUQ, I do not think the pt has acute cholecystitis, pancreatitis.  No cough, doubt basilar PNA.  Pt may have functional abdominal pain.  Treated symptomatically - she got good relief.  Will advise her to follow up with GI.   I explained the diagnosis and have given explicit precautions to return to the ER including any other new or worsening symptoms. The patient understands and accepts the medical plan as it's been dictated and I have answered their questions. Discharge instructions concerning home care and prescriptions have been given.  The patient is STABLE and is discharged to home in good condition.         Jones Skene, MD 03/14/12 2101

## 2012-03-13 NOTE — ED Notes (Signed)
Back pain from shoulders all way down, abd pain, thinks she may be dehydrated and belly button turning brown

## 2012-09-17 ENCOUNTER — Other Ambulatory Visit (HOSPITAL_COMMUNITY): Payer: Self-pay | Admitting: *Deleted

## 2012-09-17 DIAGNOSIS — N644 Mastodynia: Secondary | ICD-10-CM

## 2012-09-17 DIAGNOSIS — N6019 Diffuse cystic mastopathy of unspecified breast: Secondary | ICD-10-CM

## 2012-09-23 ENCOUNTER — Ambulatory Visit (HOSPITAL_COMMUNITY)
Admission: RE | Admit: 2012-09-23 | Discharge: 2012-09-23 | Disposition: A | Discharge status: Home / Self Care | Payer: Medicaid Other | Source: Ambulatory Visit | Attending: Nurse Practitioner | Admitting: Nurse Practitioner

## 2012-09-23 DIAGNOSIS — N6019 Diffuse cystic mastopathy of unspecified breast: Secondary | ICD-10-CM

## 2012-09-23 DIAGNOSIS — N644 Mastodynia: Secondary | ICD-10-CM | POA: Insufficient documentation

## 2012-10-29 ENCOUNTER — Ambulatory Visit (INDEPENDENT_AMBULATORY_CARE_PROVIDER_SITE_OTHER): Payer: Medicaid Other | Admitting: Gastroenterology

## 2012-10-29 ENCOUNTER — Other Ambulatory Visit: Payer: Self-pay | Admitting: Gastroenterology

## 2012-10-29 ENCOUNTER — Encounter: Payer: Self-pay | Admitting: Gastroenterology

## 2012-10-29 VITALS — BP 115/71 | HR 101 | Temp 98.5°F | Ht 61.0 in | Wt 104.4 lb

## 2012-10-29 DIAGNOSIS — K5900 Constipation, unspecified: Secondary | ICD-10-CM | POA: Insufficient documentation

## 2012-10-29 DIAGNOSIS — R109 Unspecified abdominal pain: Secondary | ICD-10-CM | POA: Insufficient documentation

## 2012-10-29 DIAGNOSIS — R195 Other fecal abnormalities: Secondary | ICD-10-CM | POA: Insufficient documentation

## 2012-10-29 DIAGNOSIS — K625 Hemorrhage of anus and rectum: Secondary | ICD-10-CM

## 2012-10-29 MED ORDER — LUBIPROSTONE 24 MCG PO CAPS
24.0000 ug | ORAL_CAPSULE | Freq: Two times a day (BID) | ORAL | Status: DC
Start: 2012-10-29 — End: 2013-04-05

## 2012-10-29 MED ORDER — OMEPRAZOLE 20 MG PO CPDR: 20.0000 mg | DELAYED_RELEASE_CAPSULE | Freq: Every day | ORAL | Status: DC

## 2012-10-29 MED ORDER — HYDROCORTISONE ACETATE 25 MG RE SUPP: 25.0000 mg | Freq: Two times a day (BID) | RECTAL | Status: DC

## 2012-10-29 NOTE — Patient Instructions (Addendum)
For reflux: start taking Prilosec each morning, 30 minutes before breakfast. I have given you samples and sent a prescription to your pharmacy.  For constipation: start taking Amitiza 1 capsule WITH FOOD each evening for 3 nights. If you do well with this, increase to twice a day WITH FOOD to avoid nausea. We have provided samples. If this does not work, let us know and we can try something else.  Start taking the suppositories twice a day for 6 days. This is for rectal discomfort, itching, bleeding.  We have set you up for a colonoscopy and upper endoscopy with Dr. Darrick Penna in the near future.

## 2012-10-29 NOTE — Progress Notes (Signed)
Cc PCP 

## 2012-10-29 NOTE — Assessment & Plan Note (Signed)
27 year old female found to be [facility]; no anemia on recent CBC. Patient notes intermittent low-volume hematochezia in the setting of constipation; she also reports possible melena on several different occasions. She has vague nausea that has been worsening in severity, aggravated with eating, and reported weight loss down to 80 lbs. She is now gaining weight again, close to her original weight in the 115 range. RLQ abdominal discomfort is vague, without any associated or relieving factors. 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 She also reports significant stress with her adopted son, who is 5. Multiple GI complaints; likely dealing with IBS-C, internal hemorrhoids, possible gastritis, PUD, unable to rule out evolving IBD. As she is of child-bearing age, will obtain pregnancy test now. Plan as follows:  Proceed with colonoscopy/EGD with Dr. Darrick Penna in the near future. The risks, benefits, and alternatives have been discussed in detail with the patient. They state understanding and desire to proceed.  Pregnancy test now TSH  Start Prilosec daily for GERD Amitiza 24 mcg samples provided for constipation Anusol suppositories due to rectal discomfort, low-volume hematochezia Further recommendations after procedures completed

## 2012-10-29 NOTE — Progress Notes (Signed)
Referring Provider: [facility] Primary Care Physician:  Tylene Fantasia., PA-C Primary Gastroenterologist:  Dr. Darrick Penna   Chief Complaint  Patient presents with  . Abdominal Pain    right lower  . Nausea    HPI:   [name] [name] is a 27 year old female here for initial consultation regarding heme positive stool, IBS, and abdominal pain.   Notes urgency of bowel movements. Intermittent constipation, will sit long periods of time without any results. Mucus in stools. Hard balls every few days followed by liquid stool at times. Now notes rectal discomfort, "shooting up", knife-like, not associated with bowel habits. Nausea after eating, unable to finish food. Nausea worsened with eating but no vomiting. Tries to eat high fiber foods to help with bowel movements. Issues with constipation started after last C-section August 2012. Worsening in severity over past several months. Notes RLQ pain, radiates around to back, intermittent, unsure if relieved by defecation. Intermittent hematochezia, low-volume. Uses Prep H for itching. Nausea worsened after C-section. States she has seen black, tarry stool but not constant. No pain with eating, only nausea. No NSAIDs. No aspirin powders. Normal weight 115, had dropped down to 80 lbs, now up to 104. Zofran helped from the ED, but now she is out. +GERD, no PPI. Also states she feels like razor blades when she swallows, feels like her neck is swollen. Pain in left axilla, but she has had an Korea of bilateral breasts which was negative.   A lot of stress at home, 52-year-old son with issues. +anxiety.    Outside labs with Hgb 13, normal LFTs.   Past Medical History  Diagnosis Date  . GERD (gastroesophageal reflux disease)   . Anxiety   . Anemia     per patient, iron-deficiency  . Stress at home     Past Surgical History  Procedure Laterality Date  . Cesarean section  2008/2012    x2    Current Outpatient Prescriptions  Medication Sig Dispense  Refill  . levonorgestrel (MIRENA) 20 MCG/24HR IUD 1 each by Intrauterine route once.      . hydrocortisone (ANUSOL-HC) 25 MG suppository Place 1 suppository (25 mg total) rectally every 12 (twelve) hours.  12 suppository  1  . lubiprostone (AMITIZA) 24 MCG capsule Take 1 capsule (24 mcg total) by mouth 2 (two) times daily with a meal.  60 capsule  3  . omeprazole (PRILOSEC) 20 MG capsule Take 1 capsule (20 mg total) by mouth daily.  30 capsule  3   No current facility-administered medications for this visit.    Allergies as of 10/29/2012 - Review Complete 03/13/2012  Allergen Reaction Noted  . Bee venom Swelling 11/05/2011  . Penicillins Other (See Comments) 05/25/2011    Family History  Problem Relation Age of Onset  . Colon cancer Neg Hx     History   Social History  . Marital Status: Divorced    Spouse Name: N/A    Number of Children: N/A  . Years of Education: N/A   Occupational History  . Not on file.   Social History Main Topics  . Smoking status: Former Smoker -- 0.50 packs/day    Types: Cigarettes    Quit date: 05/06/2012  . Smokeless tobacco: Not on file     Comment: quit Jan 2014   . Alcohol Use: No  . Drug Use: No  . Sexually Active: Yes    Birth Control/ Protection: Inserts     Comment: Mirena   Other Topics Concern  .  Not on file   Social History Narrative   45-year-old   15-year-old           Review of Systems: Negative unless mentioned in HPI.  Physical Exam: BP 115/71  Pulse 101  Temp(Src) 98.5 F (36.9 C) (Oral)  Ht 5\' 1"  (1.549 m)  Wt 104 lb 6.4 oz (47.356 kg)  BMI 19.74 kg/m2 General:   Alert and oriented. Well-developed, well-nourished, pleasant and cooperative. Head:  Normocephalic and atraumatic. Eyes:  Conjunctiva pink, sclera clear, no icterus.   Conjunctiva pink. Ears:  Normal auditory acuity. Nose:  No deformity, discharge,  or lesions. Mouth:  No deformity or lesions, mucosa pink and moist.  Neck:  Supple, without mass or  thyromegaly. Lungs:  Clear to auscultation bilaterally, without wheezing, rales, or rhonchi.  Heart:  S1, S2 present without murmurs noted.  Abdomen:  +BS, soft, non-tender and non-distended. Without mass or HSM. No rebound or guarding. No hernias noted. Rectal:  Deferred  Msk:  Symmetrical without gross deformities. Normal posture. Pulses:  Normal pulses noted. Extremities:  Without clubbing or edema. Neurologic:  Alert and  oriented x4;  grossly normal neurologically. Skin:  Intact, warm and dry without significant lesions or rashes Cervical Nodes:  No significant cervical adenopathy. Psych:  Alert and cooperative. Normal mood and affect.

## 2012-10-29 NOTE — Assessment & Plan Note (Signed)
Trial of Amitiza 24 mcg

## 2012-10-29 NOTE — Assessment & Plan Note (Signed)
See heme positive stool.

## 2012-10-30 ENCOUNTER — Encounter (HOSPITAL_COMMUNITY): Payer: Self-pay | Admitting: Pharmacy Technician

## 2012-10-31 LAB — PREGNANCY, URINE: Preg Test, Ur: NEGATIVE

## 2012-11-02 ENCOUNTER — Telehealth: Payer: Self-pay

## 2012-11-02 NOTE — Telephone Encounter (Signed)
Pt called for lab results of 10/29/2012. I told her Tobi Bastos has not had a  Chance to sign off, but I will let her know.

## 2012-11-03 LAB — CBC
AST: 14 U/L
Albumin: 4.8
Alkaline Phosphatase: 48 U/L
BUN: 11 mg/dL (ref 4–21)

## 2012-11-03 NOTE — Progress Notes (Signed)
Quick Note:  Pt returned call and was informed. ______ 

## 2012-11-03 NOTE — Progress Notes (Signed)
LMOM to call.

## 2012-11-03 NOTE — Progress Notes (Signed)
Quick Note:  LMOM for a return call. ______ 

## 2012-11-03 NOTE — Progress Notes (Signed)
Quick Note:  Negative pregnancy test.  TSH is only slightly elevated; doubt this is causing any significant symptoms, but she [facility] work as needed. Continue with TCS/EGD as planned. ______

## 2012-11-03 NOTE — Telephone Encounter (Signed)
See results note. 

## 2012-11-04 ENCOUNTER — Encounter (HOSPITAL_COMMUNITY)
Admission: RE | Disposition: A | Discharge status: Home / Self Care | Payer: Self-pay | Source: Ambulatory Visit | Attending: Gastroenterology

## 2012-11-04 ENCOUNTER — Encounter (HOSPITAL_COMMUNITY): Payer: Self-pay | Admitting: *Deleted

## 2012-11-04 ENCOUNTER — Ambulatory Visit (HOSPITAL_COMMUNITY)
Admission: RE | Admit: 2012-11-04 | Discharge: 2012-11-04 | Disposition: A | Discharge status: Home / Self Care | Payer: Medicaid Other | Source: Ambulatory Visit | Attending: Gastroenterology | Admitting: Gastroenterology

## 2012-11-04 ENCOUNTER — Telehealth: Payer: Self-pay | Admitting: Gastroenterology

## 2012-11-04 DIAGNOSIS — R198 Other specified symptoms and signs involving the digestive system and abdomen: Secondary | ICD-10-CM

## 2012-11-04 DIAGNOSIS — K2990 Gastroduodenitis, unspecified, without bleeding: Secondary | ICD-10-CM

## 2012-11-04 DIAGNOSIS — K625 Hemorrhage of anus and rectum: Secondary | ICD-10-CM

## 2012-11-04 DIAGNOSIS — K2940 Chronic atrophic gastritis without bleeding: Secondary | ICD-10-CM | POA: Insufficient documentation

## 2012-11-04 DIAGNOSIS — R1013 Epigastric pain: Secondary | ICD-10-CM

## 2012-11-04 DIAGNOSIS — K648 Other hemorrhoids: Secondary | ICD-10-CM | POA: Insufficient documentation

## 2012-11-04 DIAGNOSIS — K2970 Gastritis, unspecified, without bleeding: Secondary | ICD-10-CM

## 2012-11-04 DIAGNOSIS — K5900 Constipation, unspecified: Secondary | ICD-10-CM

## 2012-11-04 HISTORY — PX: COLONOSCOPY WITH ESOPHAGOGASTRODUODENOSCOPY (EGD): SHX5779

## 2012-11-04 SURGERY — COLONOSCOPY WITH ESOPHAGOGASTRODUODENOSCOPY (EGD)
Anesthesia: Moderate Sedation

## 2012-11-04 MED ORDER — BUTAMBEN-TETRACAINE-BENZOCAINE 2-2-14 % EX AERO
INHALATION_SPRAY | CUTANEOUS | Status: DC | PRN
  Administered 2012-11-04: 2 via TOPICAL

## 2012-11-04 MED ORDER — MEPERIDINE HCL 100 MG/ML IJ SOLN
INTRAMUSCULAR | Status: DC | PRN
  Administered 2012-11-04 (×3): 25 mg via INTRAVENOUS

## 2012-11-04 MED ORDER — SODIUM CHLORIDE 0.9 % IV SOLN
INTRAVENOUS | Status: DC
  Administered 2012-11-04: 10:00:00 via INTRAVENOUS

## 2012-11-04 MED ORDER — PROMETHAZINE HCL 25 MG/ML IJ SOLN
12.5000 mg | Freq: Once | INTRAMUSCULAR | Status: AC
  Administered 2012-11-04: 12.5 mg via INTRAVENOUS

## 2012-11-04 MED ORDER — MEPERIDINE HCL 100 MG/ML IJ SOLN
INTRAMUSCULAR | Status: AC
  Filled 2012-11-04: qty 2

## 2012-11-04 MED ORDER — PROMETHAZINE HCL 25 MG/ML IJ SOLN
INTRAMUSCULAR | Status: AC
  Filled 2012-11-04: qty 1

## 2012-11-04 MED ORDER — DICYCLOMINE HCL 10 MG PO CAPS: ORAL_CAPSULE | ORAL | Status: DC

## 2012-11-04 MED ORDER — SODIUM CHLORIDE 0.9 % IJ SOLN
INTRAMUSCULAR | Status: AC
  Filled 2012-11-04: qty 10

## 2012-11-04 MED ORDER — MIDAZOLAM HCL 5 MG/5ML IJ SOLN
INTRAMUSCULAR | Status: DC | PRN
  Administered 2012-11-04 (×2): 2 mg via INTRAVENOUS
  Administered 2012-11-04: 1 mg via INTRAVENOUS
  Administered 2012-11-04: 2 mg via INTRAVENOUS

## 2012-11-04 MED ORDER — LIDOCAINE VISCOUS 2 % MT SOLN: OROMUCOSAL | Status: DC

## 2012-11-04 MED ORDER — MIDAZOLAM HCL 5 MG/5ML IJ SOLN
INTRAMUSCULAR | Status: AC
  Filled 2012-11-04: qty 10

## 2012-11-04 NOTE — Op Note (Signed)
[facility] [address] [place], [address]   ENDOSCOPY PROCEDURE REPORT  PATIENT: [name] [name]  MR#: [id number] BIRTHDATE: 1986-01-13 , 27  yrs. old GENDER: Female  ENDOSCOPIST: Jonette Eva, MD REFERRED BY:  PROCEDURE DATE: 11/04/2012 PROCEDURE:   EGD w/ biopsy  INDICATIONS:Epigastric pain. INTERMITTENT CONSTIPATION > LOOSE STOOLS, PMHx: IBS/ANXIETY MEDICATIONS: TCS+ Versed 1mg  IV TOPICAL ANESTHETIC:   Cetacaine Spray  DESCRIPTION OF PROCEDURE:     Physical exam was performed.  Informed consent was obtained from the patient after explaining the benefits, risks, and alternatives to the procedure.  The patient was connected to the monitor and placed in the left lateral position.  Continuous oxygen was provided by nasal cannula and IV medicine administered through an indwelling cannula.  After administration of sedation, the patients esophagus was intubated and the EC-3490TLi ([id number]) and EG-[id number] ([id number])  endoscope was advanced under direct visualization to the second portion of the duodenum.  The scope was removed slowly by carefully examining the color, texture, anatomy, and integrity of the mucosa on the way out.  The patient was recovered in endoscopy and discharged home in satisfactory condition.   ESOPHAGUS: The mucosa of the esophagus appeared normal.   STOMACH: Mild non-erosive gastritis (inflammation) was found in the gastric antrum.  Multiple biopsies were performed using cold forceps. DUODENUM: The duodenal mucosa showed no abnormalities in the bulb and second portion of the duodenum.  Cold forcep biopsies were taken in the second portion TO EVALUATE FOR EOSINOPHILIC DUODENITIS OR CELIAC SPRUE.  COMPLICATIONS:   None  ENDOSCOPIC IMPRESSION: 1.   ABDOMINAL PAIN MOST LIKELY DUE TO IBS/GASTRITISl 2.   MILD Non-erosive gastritis  RECOMMENDATIONS: DRINK WATER. FOLLOW A LOW FAT/HIGH FIBER DIET.  AVOID ITEMS THAT CAUSE  BLOATING.  CONTINUE AMITIZA. CONTINUE OMEPRAZOLE EVERY MORNING. AVOID ITEMS THAT TRIGGER GASTRITIS. BIOPSY WILL BE BACK IN 7 DAYS. FOLLOW UP IN 3 MOS.   REPEAT EXAM:   _______________________________ [name], MD 11/04/2012 7:23 PM       PATIENT NAME:  [name], [name] MR#: [id number]

## 2012-11-04 NOTE — Op Note (Signed)
[facility] [address] [place], [address]   COLONOSCOPY PROCEDURE REPORT  PATIENT: [name], [name]  MR#: [id number] BIRTHDATE: 04/06/1986 , 27  yrs. old GENDER: Female ENDOSCOPIST: Jonette Eva, MD REFERRED QI:[id number] Muse, PA PROCEDURE DATE:  11/04/2012 PROCEDURE:   Colonoscopy, diagnostic INDICATIONS:Rectal Bleeding and Change in bowel habits. MEDICATIONS: PREOP: Promethazine (Phenergan) 12.5mg  IV, Demerol 75 mg IV, and Versed 6 mg IV  DESCRIPTION OF PROCEDURE:    Physical exam was performed.  Informed consent was obtained from the patient after explaining the benefits, risks, and alternatives to procedure.  The patient was connected to monitor and placed in left lateral position. Continuous oxygen was provided by nasal cannula and IV medicine administered through an indwelling cannula.  After administration of sedation and rectal exam, the patients rectum was intubated and the EC-[id number] ([id number])  colonoscope was advanced under direct visualization to the ileum.  The scope was removed slowly by carefully examining the color, texture, anatomy, and integrity mucosa on the way out.  The patient was recovered in endoscopy and discharged home in satisfactory condition.    COLON FINDINGS: The mucosa appeared normal in the terminal ileum.  10-15 CM VISUALIZED. A normal appearing cecum, ileocecal valve, and appendiceal orifice were identified.  The ascending, hepatic flexure, transverse, splenic flexure, descending, sigmoid colon and rectum appeared unremarkable.  No polyps or cancers were seen. Moderate sized internal hemorrhoids were found.  PREP QUALITY: excellent.    CECAL W/D TIME: 10 minutes COMPLICATIONS: None  ENDOSCOPIC IMPRESSION: 1.   Normal mucosa in the terminal ileum 2.   Normal colon 3.   RECTAL BLEEDING DUE TO Moderate sized internal hemorrhoids  RECOMMENDATIONS: DRINK WATER. FOLLOW A LOW FAT/HIGH FIBER DIET.  AVOID ITEMS THAT CAUSE  BLOATING.  CONTINUE AMITIZA. FOLLOW UP IN 3 MOS.  7/2 1820: PT CALLED WITH ABD PAIN/RECTAL URGENCY. RX SENT FOR BENTYL/VISCOUS LIDOCAINE.     _______________________________ [name], MD 11/04/2012 7:15 PM

## 2012-11-04 NOTE — Progress Notes (Signed)
REVIEWED.  

## 2012-11-04 NOTE — H&P (Signed)
  Primary Care Physician:  Tylene Fantasia., PA-C Primary Gastroenterologist:  Dr. Darrick Penna  Pre-Procedure History & Physical: HPI:  [name] [name] is a 27 y.o. female here for BRBPR/HEME POS STOOLS/?MELENA-NL HB.  Past Medical History  Diagnosis Date  . GERD (gastroesophageal reflux disease)   . Anxiety   . Anemia     per patient, iron-deficiency  . Stress at home     Past Surgical History  Procedure Laterality Date  . Cesarean section  2008/2012    x2    Prior to Admission medications   Medication Sig Start Date End Date Taking? Authorizing Provider  levonorgestrel (MIRENA) 20 MCG/24HR IUD 1 each by Intrauterine route once.   Yes Historical Provider, MD  lubiprostone (AMITIZA) 24 MCG capsule Take 1 capsule (24 mcg total) by mouth 2 (two) times daily with a meal. 10/29/12  Yes Nira Retort, NP  omeprazole (PRILOSEC) 20 MG capsule Take 1 capsule (20 mg total) by mouth daily. 10/29/12  Yes Nira Retort, NP    Allergies as of 10/29/2012 - Review Complete 10/29/2012  Allergen Reaction Noted  . Bee venom Swelling 11/05/2011  . Penicillins Other (See Comments) 05/25/2011    Family History  Problem Relation Age of Onset  . Colon cancer Neg Hx     History   Social History  . Marital Status: Divorced    Spouse Name: N/A    Number of Children: N/A  . Years of Education: N/A   Occupational History  . Not on file.   Social History Main Topics  . Smoking status: Current Some Day Smoker -- 0.50 packs/day    Types: Cigarettes    Last Attempt to Quit: 05/06/2012  . Smokeless tobacco: Not on file     Comment: quit Jan 2014   . Alcohol Use: No  . Drug Use: No  . Sexually Active: Yes    Birth Control/ Protection: Inserts     Comment: Mirena   Other Topics Concern  . Not on file   Social History Narrative   68-year-old   60-year-old           Review of Systems: See HPI, otherwise negative ROS   Physical Exam: BP 118/80  Pulse 115  Temp(Src) 98.3 F (36.8 C)  (Oral)  Resp 16  SpO2 100% General:   Alert,  pleasant and cooperative in NAD Head:  Normocephalic and atraumatic. Neck:  Supple; Lungs:  Clear throughout to auscultation.    Heart:  Regular rate and rhythm. Abdomen:  Soft, nontender and nondistended. Normal bowel sounds, without guarding, and without rebound.   Neurologic:  Alert and  oriented x4;  grossly normal neurologically.  Impression/Plan:     BRBPR/HEME POS STOOLS/?MELENA-NL HB  PLAN: TCS/EGD TODAY

## 2012-11-04 NOTE — Telephone Encounter (Signed)
FIANCEE CALLED. PT ATE A HAMBURGER AFTER TCS. PT HAVING ABDOMINAL PAIN AND FEEL SLIKE SHE HAS TO GO TO THE BR BUT NOTHING COMES OUT. PT ALREADY TOOK AMITIZA AND OMEPRAZOLE TODAY. RECOMMENDED FULL LIQUID DIET FOR 3 DAYS. GO TO [email or web address] FOR INSTRUCTIONS. TYLENOL/2 OTC IBUPROFEN Q6H FOR 3 DAYS. ADD BENTYL TO NIGHT AND REPEAT IN 4 HOURS. MAY REPEAT Q4-6H PRN FOR ABDOMINAL PAIN AND RECTAL URGENCY. VISCOUS LIDOCAINE PRN. EXPLAINED WE DO NOT RX NARCOTICS FOR ABDOMINAL PAIN. WILL CALL PT JUL 3 TO FOLLOW UP.

## 2012-11-05 NOTE — Telephone Encounter (Signed)
Called.LMOM I was calling to check on her and to please return my call.

## 2012-11-05 NOTE — Telephone Encounter (Signed)
PLEASE CALL PT.  LET HE KNOW WE ARE CALLING TO CHECK ON HER ABDOMINAL PAIN.

## 2012-11-05 NOTE — Telephone Encounter (Signed)
REVIEWED. Agree.  

## 2012-11-05 NOTE — Telephone Encounter (Signed)
Spoke to pt. She said she is very nauseated now and could not eat all of her [employer] meal this AM. She had gravy biscuit and egg, but could not eat her hash rounds and bacon. I told her DR. Fields said for her to have a full liquid diet for the next 3 days. She said Dr. Darrick Penna talked to her boy friend and not her and he did not tell her. So I asked her to do that. She said she still hurts some. But she will try the diet and the meds, she had not taken those yet. She will call if she has other concerns.

## 2012-11-09 ENCOUNTER — Telehealth: Payer: Self-pay | Admitting: Gastroenterology

## 2012-11-09 NOTE — Telephone Encounter (Signed)
Pt called to see if her results were back. Please call her back at [phone]

## 2012-11-09 NOTE — Telephone Encounter (Signed)
I called pt and told her Dr. Darrick Penna is on vacation. I will let her know when she returns that she would like to hear from it. I told her it did not look like anything serious, but will call when SF signs off on it.

## 2012-11-10 ENCOUNTER — Encounter (HOSPITAL_COMMUNITY): Payer: Self-pay | Admitting: Gastroenterology

## 2012-11-11 NOTE — Telephone Encounter (Signed)
Cc PCP 

## 2012-11-11 NOTE — Telephone Encounter (Signed)
Pt called again for results. I told her Dr. Darrick Penna is seeing pt's and I will try to call her by end of day if possible.

## 2012-11-11 NOTE — Telephone Encounter (Signed)
Please call pt. LET HER KNOW ENDOSCOPY RESULTS MAY TAKE UP TO 7 BUSINESS DAYS TO RETURN. HER stomach Bx shows gastritis. HER ABDOMINAL PAIN IS DUE TO GASTRITIS & IBS/CONSTIPATION.  SHE SHOULD:  DRINK WATER TO KEEP YOUR URINE LIGHT YELLOW.  FOLLOW A LOW FAT/HIGH FIBER DIET. AVOID ITEMS THAT CAUSE BLOATING. SEE INFO BELOW.  CONTINUE AMITIZA.  CONTINUE OMEPRAZOLE EVERY MORNING.  AVOID ITEMS THAT TRIGGER GASTRITIS.   FOLLOW UP IN 3 MOS E30 AS DX: ABD PAIN/CONSTIPATION.

## 2012-11-11 NOTE — Telephone Encounter (Signed)
Called and informed pt.  

## 2012-11-17 ENCOUNTER — Telehealth: Payer: Self-pay

## 2012-11-17 NOTE — Telephone Encounter (Signed)
Pt want to know what bx show I told the results again that was listed by [facility]. She also would like to know if there is anything different she can take then the lidocaine because it make her whole body numb.Please advise

## 2012-11-18 ENCOUNTER — Telehealth (INDEPENDENT_AMBULATORY_CARE_PROVIDER_SITE_OTHER): Payer: Self-pay

## 2012-11-18 NOTE — Telephone Encounter (Signed)
PLEASE CALL PT. SHE SHOULD STOP TAKING VISCOUS LIDOCAINE. HER ABDOMINAL PAIN IS DUE TO GASTRITIS & IBS/CONSTIPATION. SHE SHOULD COMPLETE HER RX FOR HEMORRHOIDS SHE MAY HAVE AN OPV-[facility] SLOT ON 7/31 AT 1100 W/ [facility] TO DISCUSS/START [facility] HEMORROID BANDING.   SHE SHOULD:  DRINK WATER TO KEEP YOUR URINE LIGHT YELLOW.  FOLLOW A LOW FAT/HIGH FIBER DIET. AVOID ITEMS THAT CAUSE BLOATING.  CONTINUE AMITIZA.  USE PREPARATION HAS NEEDED FOR HER HEMORRHOIDS. FOR HER ABDOMINAL PAIN SHE SHOULD USE TYLENOL OR 2 OTC IBUPROFEN TID  WITH FOOD. FOR 3 DAYS. CONTINUE OMEPRAZOLE EVERY MORNING.  AVOID ITEMS THAT TRIGGER GASTRITIS.   FOLLOW UP IN 3 MOS E30 AS DX: ABD PAIN/CONSTIPATION

## 2012-11-18 NOTE — Telephone Encounter (Signed)
Pt called and asked about results. I went over that with her. She requests something different than Lidocaine for abdominal pain. York Spaniel it is making her toes and brain numb. She also wants to know if she should take stool softners. She is about to run out of the Proctosol HC 2.5 cream and she still has hemorrhoids. Please advise! ( she uses [facility] in [place]).

## 2012-11-18 NOTE — Telephone Encounter (Signed)
REVIEWED.  

## 2012-11-19 NOTE — Telephone Encounter (Signed)
PT HAS AN OV TO SEE ANNA 10/2

## 2012-11-19 NOTE — Telephone Encounter (Signed)
Pt returned call and was informed. She wants to think about the hemorrhoid banding. Mailing her a pamphlet on the hemorrhoid banding.

## 2012-11-19 NOTE — Telephone Encounter (Signed)
LM for pt to returncall

## 2012-11-20 NOTE — Telephone Encounter (Signed)
REVIEWED.  

## 2012-12-24 ENCOUNTER — Emergency Department (HOSPITAL_COMMUNITY)
Admission: EM | Admit: 2012-12-24 | Discharge: 2012-12-25 | Disposition: A | Discharge status: Home Health | Payer: Medicaid Other | Attending: Emergency Medicine | Admitting: Emergency Medicine

## 2012-12-24 ENCOUNTER — Encounter (HOSPITAL_COMMUNITY)
Admission: EM | Disposition: A | Discharge status: Home Health | Payer: Self-pay | Source: Home / Self Care | Attending: Emergency Medicine

## 2012-12-24 ENCOUNTER — Encounter (HOSPITAL_COMMUNITY): Payer: Self-pay | Admitting: *Deleted

## 2012-12-24 ENCOUNTER — Emergency Department (HOSPITAL_COMMUNITY): Payer: Medicaid Other

## 2012-12-24 DIAGNOSIS — T18108A Unspecified foreign body in esophagus causing other injury, initial encounter: Secondary | ICD-10-CM

## 2012-12-24 DIAGNOSIS — IMO0002 Reserved for concepts with insufficient information to code with codable children: Secondary | ICD-10-CM | POA: Insufficient documentation

## 2012-12-24 DIAGNOSIS — R1310 Dysphagia, unspecified: Secondary | ICD-10-CM | POA: Insufficient documentation

## 2012-12-24 HISTORY — PX: ESOPHAGOGASTRODUODENOSCOPY: SHX5428

## 2012-12-24 SURGERY — EGD (ESOPHAGOGASTRODUODENOSCOPY)
Anesthesia: Moderate Sedation

## 2012-12-24 MED ORDER — MEPERIDINE HCL 100 MG/ML IJ SOLN
INTRAMUSCULAR | Status: DC | PRN
  Administered 2012-12-24: 50 mg via INTRAVENOUS
  Administered 2012-12-24: 25 mg via INTRAVENOUS

## 2012-12-24 MED ORDER — MIDAZOLAM HCL 5 MG/5ML IJ SOLN
INTRAMUSCULAR | Status: DC | PRN
  Administered 2012-12-24: 1 mg via INTRAVENOUS
  Administered 2012-12-24: 2 mg via INTRAVENOUS
  Administered 2012-12-24: 1 mg via INTRAVENOUS

## 2012-12-24 MED ORDER — PROMETHAZINE HCL 25 MG/ML IJ SOLN
INTRAMUSCULAR | Status: DC | PRN
  Administered 2012-12-24: 12.5 mg via INTRAVENOUS

## 2012-12-24 MED ORDER — STERILE WATER FOR IRRIGATION IR SOLN
Status: DC | PRN
  Administered 2012-12-24: 23:00:00

## 2012-12-24 NOTE — Op Note (Signed)
[facility] [address] [place], [address]   ENDOSCOPY PROCEDURE REPORT  PATIENT: [name] [name], [name] [name]  MR#: [id number] BIRTHDATE: [birth year] , 27  yrs. old GENDER: Female  ENDOSCOPIST: Jonette Eva, MD REFERRED XB:[id number] Muse, PA  PROCEDURE DATE: 12/24/2012 PROCEDURE:   EGD w/ biopsy  INDICATIONS:Dysphagia. PT REPORTS PIECE OF PORK CHOP IS STUCK IN HER ESOPHAGUS. EGD/TCS JUL 2014-GASTRITIS MEDICATIONS: Promethazine (Phenergan) 12.5mg  IV, Demerol 75 mg IV, and Versed 4 mg IV TOPICAL ANESTHETIC:   none  DESCRIPTION OF PROCEDURE:     Physical exam was performed.  Informed consent was obtained from the patient after explaining the benefits, risks, and alternatives to the procedure.  The patient was connected to the monitor and placed in the left lateral position.  Continuous oxygen was provided by nasal cannula and IV medicine administered through an indwelling cannula.  After administration of sedation, the patients esophagus was intubated and the EG-[id number] ([id number])  endoscope was advanced under direct visualization to the second portion of the duodenum.  The scope was removed slowly by carefully examining the color, texture, anatomy, and integrity of the mucosa on the way out.  The patient was recovered in endoscopy and discharged home in satisfactory condition.    MEAT SEEN IN THE HYPOPHARYNX W/O OBSTRUCTION. ESOPHAGUS: The mucosa of the esophagus appeared normal.  Multiple biopsies were performed.   BIOPSIES 20 CM AND 32 CM FROM THE TEETH TO EVALUATE FOR EOSINOPHILIC ESOPHAGITIS. GE JXN 37 CM FROM THE TEETH. LIMITED VIEW OF GASTRIC MUCOSA DUE TO RETAINED GASTRIC CONTENTS.  DUODENUM: The duodenal mucosa showed no abnormalities.  COMPLICATIONS:   PT DID GAG AND REGURGITATE GASTRIC CONTENTS AS THE SCOPE WAS BEING WITHDRAWN.  ENDOSCOPIC IMPRESSION: 1.   NORMAL esophagus 2.   LIMITED VIEW OF GASTRIC MUCOSA DUE TO RETAINED  GASTRIC CONTENTS.  RECOMMENDATIONS: AWAIT BIOPSY OMEPRAZOLE DAILY SOFT MECHANICAL/LOW FAT DIET.  MEATS SHOULD BE CHOPPED OR GROUND ONLY. IF NO EOS ESOPHAGITIS, PT NEEDS BPE OPV OCT 2014   REPEAT EXAM:   _______________________________ [name], MD 12/24/2012 11:35 PM       PATIENT NAME:  [name] [name] MR#: [id number]

## 2012-12-24 NOTE — H&P (Signed)
  Primary Care Physician:  Tylene Fantasia., PA-C Primary Gastroenterologist:  Dr. Darrick Penna  Pre-Procedure History & Physical: HPI:  [name] [name] is a 27 y.o. female here for ?FOOD IMPACTION.   Past Medical History  Diagnosis Date  . GERD (gastroesophageal reflux disease)   . Anxiety   . Anemia     per patient, iron-deficiency  . Stress at home    Past Surgical History  Procedure Laterality Date  . Cesarean section  2008/2012    x2  . Colonoscopy with esophagogastroduodenoscopy (egd) N/A 11/04/2012    Procedure: COLONOSCOPY WITH ESOPHAGOGASTRODUODENOSCOPY (EGD);  Surgeon: West Bali, MD;  Location: [facility];  Service: Endoscopy;  Laterality: N/A;  10:30-moved to 10:15 Soledad Gerlach to notify pt    Prior to Admission medications   Medication Sig Start Date End Date Taking? Authorizing Provider  dicyclomine (BENTYL) 10 MG capsule 1 OR 2 PO Q4-6 H PRN ABDOMINAL PAIN/RECTAL URGENCY. NO MORE THAN 8 PER DAY. 11/04/12   West Bali, MD  levonorgestrel (MIRENA) 20 MCG/24HR IUD 1 each by Intrauterine route once.    Historical Provider, MD  lidocaine (XYLOCAINE) 2 % solution 2 TSP  PO Q4-6H PRN FOR ABDOMINAL PAIN 11/04/12   West Bali, MD  lubiprostone (AMITIZA) 24 MCG capsule Take 1 capsule (24 mcg total) by mouth 2 (two) times daily with a meal. 10/29/12   Nira Retort, NP  omeprazole (PRILOSEC) 20 MG capsule Take 1 capsule (20 mg total) by mouth daily. 10/29/12   Nira Retort, NP    Allergies as of 12/24/2012 - Review Complete 12/24/2012  Allergen Reaction Noted  . Bee venom Swelling 11/05/2011  . Lidocaine viscous  11/18/2012  . Penicillins Other (See Comments) 05/25/2011    Family History  Problem Relation Age of Onset  . Colon cancer Neg Hx     History   Social History  . Marital Status: Divorced    Spouse Name: N/A    Number of Children: N/A  . Years of Education: N/A   Occupational History  . Not on file.   Social History Main Topics  . Smoking status: Current  Some Day Smoker -- 0.50 packs/day    Types: Cigarettes    Last Attempt to Quit: 05/06/2012  . Smokeless tobacco: Not on file     Comment: quit Jan 2014   . Alcohol Use: No  . Drug Use: No  . Sexual Activity: Yes    Birth Control/ Protection: Inserts     Comment: Mirena   Other Topics Concern  . Not on file   Social History Narrative   67-year-old   35-year-old           Review of Systems: See HPI, otherwise negative ROS   Physical Exam: BP 135/73  Pulse 138  Temp(Src) 99.1 F (37.3 C) (Oral)  Resp 20  Ht 5\' 1"  (1.549 m)  Wt 110 lb (49.896 kg)  BMI 20.8 kg/m2  SpO2 100% General:   Alert,  pleasant and cooperative in NAD Head:  Normocephalic and atraumatic. Neck:  Supple; Lungs:  Clear throughout to auscultation.    Heart:  Regular rate and rhythm. Abdomen:  Soft, nontender and nondistended. Normal bowel sounds, without guarding, and without rebound.   Neurologic:  Alert and  oriented x4;  grossly normal neurologically.  Impression/Plan:     FOOD IMPACTION  PLAN:  EGD/?DIL TODAY

## 2012-12-24 NOTE — ED Notes (Signed)
Pt swallowed a piece of bone from a pork chop.

## 2012-12-24 NOTE — ED Notes (Signed)
Dr. Darrick Penna called and ENDO team called in per nursing ac.

## 2012-12-24 NOTE — ED Provider Notes (Addendum)
CSN: [id number]     Arrival date & time 12/24/12  2113 History    This chart was scribed for Claudean Kinds, MD by Dorothey Baseman, ED Scribe qand Bennett Scrape, ED Scribe. This patient was seen in room [facility] and the patient's care was started at 9:38 PM.   First MD Initiated Contact with Patient 12/24/12 2138    Chief Complaint  Patient presents with  . Swallowed Foreign Body    The history is provided by the patient and the spouse. No language interpreter was used.   HPI Comments: [name] [name] is a 27 y.o. female who presents to the Emergency Department complaining of swallowing a possible piece of bone from a pork chop around 9:10PM while eating dinner. She reports associated drooling, spitting, and vomiting. She reports seeing chunks of pork chop in her vomit. She is not able to articulate well. She reports feeling like the object is in her throat, not in her chest. She denies having any pain or shortness of breath.   Past Medical History  Diagnosis Date  . GERD (gastroesophageal reflux disease)   . Anxiety   . Anemia     per patient, iron-deficiency  . Stress at home    Past Surgical History  Procedure Laterality Date  . Cesarean section  2008/2012    x2  . Colonoscopy with esophagogastroduodenoscopy (egd) N/A 11/04/2012    Procedure: COLONOSCOPY WITH ESOPHAGOGASTRODUODENOSCOPY (EGD);  Surgeon: West Bali, MD;  Location: [facility];  Service: Endoscopy;  Laterality: N/A;  10:30-moved to 10:15 Soledad Gerlach to notify pt   Family History  Problem Relation Age of Onset  . Colon cancer Neg Hx    History  Substance Use Topics  . Smoking status: Current Some Day Smoker -- 0.50 packs/day    Types: Cigarettes    Last Attempt to Quit: 05/06/2012  . Smokeless tobacco: Not on file     Comment: quit Jan 2014   . Alcohol Use: No   OB History   Grav Para Term Preterm Abortions TAB SAB Ect Mult Living                 Review of Systems  Constitutional: Negative for  fever, chills, diaphoresis, appetite change and fatigue.  HENT: Positive for drooling, trouble swallowing and voice change. Negative for sore throat and mouth sores.   Eyes: Negative for visual disturbance.  Respiratory: Negative for cough, chest tightness and wheezing.   Gastrointestinal: Positive for vomiting. Negative for nausea, diarrhea and abdominal distention.  Endocrine: Negative for polydipsia, polyphagia and polyuria.  Genitourinary: Negative for dysuria, frequency and hematuria.  Musculoskeletal: Negative for gait problem.  Skin: Negative for color change, pallor and rash.  Neurological: Negative for dizziness, syncope and light-headedness.  Hematological: Does not bruise/bleed easily.  Psychiatric/Behavioral: Negative for behavioral problems and confusion.    Allergies  Bee venom; Lidocaine viscous; and Penicillins  Home Medications   No current outpatient prescriptions on file. Triage Vitals: BP 135/73  Pulse 138  Temp(Src) 98.1 F (36.7 C) (Oral)  Resp 20  Ht 5\' 1"  (1.549 m)  Wt 110 lb (49.896 kg)  BMI 20.8 kg/m2  SpO2 100%  Physical Exam  Nursing note and vitals reviewed. Constitutional: She appears well-developed and well-nourished. She appears distressed.  HENT:  Head: Normocephalic.  Drooling.   Eyes: Conjunctivae are normal. Pupils are equal, round, and reactive to light. No scleral icterus.  Neck: Normal range of motion. Neck supple. No thyromegaly present.  Cardiovascular:  Normal rate and regular rhythm.  Exam reveals no gallop and no friction rub.   No murmur heard. Tachycardic  Pulmonary/Chest: Effort normal and breath sounds normal. No stridor. No respiratory distress. She has no wheezes. She has no rales.  Abdominal: Soft. Bowel sounds are normal. She exhibits no distension. There is no tenderness. There is no rebound.  Psychiatric: Her behavior is normal.  Anxious    ED Course   DIAGNOSTIC STUDIES: Oxygen Saturation is 100% on room air,  normal by my interpretation.    COORDINATION OF CARE: 9:41PM- Ordered neck x-ray. Advised patient to consult a gastroenterologist. Discussed treatment plan with patient at bedside and patient agreed.    Procedures (including critical care time)  Labs Reviewed - No data to display Dg Neck Soft Tissue  12/24/2012   *RADIOLOGY REPORT*  Clinical Data: Swallowed bone  NECK SOFT TISSUES - 1+ VIEW  Comparison: None.  Findings: No radiodense foreign body.  Aryepiglottic folds unremarkable.  No prevertebral soft tissue swelling or retropharyngeal gas.  Regional soft tissues unremarkable.  The oropharynx and trachea unremarkable.  Regional bones unremarkable.  IMPRESSION:  Negative   Original Report Authenticated By: D. Andria Rhein, MD   1. Esophageal foreign body, initial encounter     MDM  She is retching here his schooling. She cannot regurgitate any food. Her soft tissue neck films are normal. I discussed the case with Dr. Darrick Penna a GI she's in route complaining traction diagnosis is upper esophageal foreign body   I personally performed the services described in this documentation, which was scribed in my presence. The recorded information has been reviewed and is accurate.  Claudean Kinds, MD 12/24/12 2254  Claudean Kinds, MD 12/24/12 [phone]

## 2012-12-28 ENCOUNTER — Telehealth: Payer: Self-pay | Admitting: Gastroenterology

## 2012-12-28 NOTE — Telephone Encounter (Signed)
PLEASE CALL PT. THE ESOPHAGUS BIOPSIES ARE NORMAL.  SHE NEEDS A BARIUM PILL ESOPHAGRAM TO EVALUATE HER ESOPHAGUS THIS WEEK.  CONTINUE OMEPRAZOLE.  FOLLOW A LOW FAT/SOFT MECHANICAL DIET. SEE INFO BELOW.  MEATS SHOULD BE CHOPPED OR GROUND ONLY. DO NOT EAT CHUNKS OF ANYTHING.  FOLLOW UP IN 3 MOS E30 AS DX: ABD PAIN/CONSTIPATION/DYSPHAGIA

## 2012-12-29 ENCOUNTER — Other Ambulatory Visit: Payer: Self-pay | Admitting: Gastroenterology

## 2012-12-29 ENCOUNTER — Encounter (HOSPITAL_COMMUNITY): Payer: Self-pay | Admitting: Gastroenterology

## 2012-12-29 ENCOUNTER — Telehealth: Payer: Self-pay | Admitting: *Deleted

## 2012-12-29 DIAGNOSIS — R1319 Other dysphagia: Secondary | ICD-10-CM

## 2012-12-29 NOTE — Telephone Encounter (Signed)
See results note of 12/28/2012, pt aware.

## 2012-12-29 NOTE — Telephone Encounter (Signed)
Pt called about a appt. For radiology dg esophagus abdg. Please advise [phone]

## 2012-12-29 NOTE — Telephone Encounter (Signed)
BPE is scheduled for Friday Aug 29th arrive in [facility] at 8:45

## 2012-12-29 NOTE — Telephone Encounter (Signed)
Called and informed pt of all the results and of the time for BPE on Friday.

## 2012-12-30 NOTE — Telephone Encounter (Signed)
Pt is aware of OV on 10/2 at 9 with AS

## 2013-01-01 ENCOUNTER — Ambulatory Visit (HOSPITAL_COMMUNITY)
Admission: RE | Admit: 2013-01-01 | Discharge: 2013-01-01 | Disposition: A | Discharge status: Home / Self Care | Payer: Medicaid Other | Source: Ambulatory Visit | Attending: Gastroenterology | Admitting: Gastroenterology

## 2013-01-01 ENCOUNTER — Telehealth: Payer: Self-pay | Admitting: *Deleted

## 2013-01-01 DIAGNOSIS — R1319 Other dysphagia: Secondary | ICD-10-CM

## 2013-01-01 NOTE — Telephone Encounter (Signed)
Pt left VM. I returned call.Left Vm that Dr. Darrick Penna said she does need the test and to please call back and ask to speak to me. ( She said on the Vm that they basically told her in radiology that because she was not having any symptoms when she went that she did not need the test).

## 2013-01-01 NOTE — Telephone Encounter (Signed)
Pt returned call and I told her DR. Fields said she needs to have the test done. I gave her radiology's phone number to call and see when they wanted her to get it done.

## 2013-01-01 NOTE — Telephone Encounter (Signed)
REVIEWED.  

## 2013-01-01 NOTE — Telephone Encounter (Signed)
Rene Kocher called from radiology stating that the pt states she did not want bariumeosphagram, she did not know why she was having one, pt states she only had swallowing trouble one time. Regina canceled the appt. But left order in there in case you can talk pt into having one.

## 2013-01-01 NOTE — Telephone Encounter (Signed)
PLEASE CALL PT. SHE NEED TO HAVE A SWALLOWING STUDY BECAUSE SHE CAME INTO THE ED IN THE MIDDLE OF NIGHT WITH FOOD STUCK IN HER ESOPHAGUS. I DID NOT SEE A STRICTURE AND SHE NEEDS A SWALLOWING STUDY TO SEE IF HER ESOPHAGUS MOVES FOOD FROM HER MOUTH TO HER STOMACH NORMALLY. IF NOT SHE WILL NEED TO SEE AN ESOPHAGUS SPECIALIST. SHE NEEDS TO COMPLETE THE STUDY TO CONTINUE OUR EVALUATION FOR HER SWALLOWING PROBLEM. IT IS NOT NORMAL FOR HER TO GET FOOD STUCK IN HER ESOPHAGUS.

## 2013-01-01 NOTE — Telephone Encounter (Signed)
Called. LMOM that Dr. Darrick Penna said that pt needs the swallowing study and to please give me a call before noon today, and that we will be off mon also.

## 2013-01-05 ENCOUNTER — Telehealth: Payer: Self-pay

## 2013-01-05 ENCOUNTER — Ambulatory Visit (HOSPITAL_COMMUNITY)
Admission: RE | Admit: 2013-01-05 | Discharge: 2013-01-05 | Disposition: A | Discharge status: Home / Self Care | Payer: Medicaid Other | Source: Ambulatory Visit | Attending: Gastroenterology | Admitting: Gastroenterology

## 2013-01-05 DIAGNOSIS — R1310 Dysphagia, unspecified: Secondary | ICD-10-CM | POA: Insufficient documentation

## 2013-01-05 NOTE — Telephone Encounter (Signed)
Pt called for X-Ray results. She just had it done This morning. I told her it is too soon , Dr has to sign off and then send message for nurse to call.  I will let Dr. Darrick Penna know that she has inquired.

## 2013-01-06 NOTE — Telephone Encounter (Signed)
Called and informed pt.  

## 2013-01-06 NOTE — Telephone Encounter (Addendum)
PLEASE CALL PT. HER BPE SHOWS HER ESOPHAGUS IS NORMAL. HER ESO BX SHOW A NL ESOPAHGUS. SHE DOES NOT NEED TO BE STRETCHED. SHE LIKELY JUST SWALLOWED A LARGE PIECE OF PORK CHOP WHICH HAD DIFFICULTY PASSING.

## 2013-01-06 NOTE — Telephone Encounter (Signed)
LMOM to call.

## 2013-02-02 ENCOUNTER — Encounter: Payer: Self-pay | Admitting: Gastroenterology

## 2013-02-04 ENCOUNTER — Telehealth: Payer: Self-pay | Admitting: Gastroenterology

## 2013-02-04 ENCOUNTER — Ambulatory Visit: Payer: Medicaid Other | Admitting: Gastroenterology

## 2013-02-04 NOTE — Telephone Encounter (Signed)
Mailed letter °

## 2013-02-04 NOTE — Telephone Encounter (Signed)
Please send letter.

## 2013-02-04 NOTE — Telephone Encounter (Signed)
Pt was a no show

## 2013-02-05 ENCOUNTER — Encounter: Payer: Self-pay | Admitting: Gastroenterology

## 2013-03-11 ENCOUNTER — Other Ambulatory Visit: Payer: Self-pay

## 2013-04-05 ENCOUNTER — Encounter (HOSPITAL_COMMUNITY): Payer: Self-pay | Admitting: Emergency Medicine

## 2013-04-05 ENCOUNTER — Emergency Department (HOSPITAL_COMMUNITY)
Admission: EM | Admit: 2013-04-05 | Discharge: 2013-04-05 | Disposition: A | Discharge status: Home / Self Care | Payer: Medicaid Other | Attending: Emergency Medicine | Admitting: Emergency Medicine

## 2013-04-05 DIAGNOSIS — IMO0001 Reserved for inherently not codable concepts without codable children: Secondary | ICD-10-CM | POA: Insufficient documentation

## 2013-04-05 DIAGNOSIS — F17200 Nicotine dependence, unspecified, uncomplicated: Secondary | ICD-10-CM | POA: Insufficient documentation

## 2013-04-05 DIAGNOSIS — S1096XA Insect bite of unspecified part of neck, initial encounter: Secondary | ICD-10-CM | POA: Insufficient documentation

## 2013-04-05 DIAGNOSIS — R51 Headache: Secondary | ICD-10-CM | POA: Insufficient documentation

## 2013-04-05 DIAGNOSIS — W57XXXA Bitten or stung by nonvenomous insect and other nonvenomous arthropods, initial encounter: Secondary | ICD-10-CM

## 2013-04-05 DIAGNOSIS — Z8719 Personal history of other diseases of the digestive system: Secondary | ICD-10-CM | POA: Insufficient documentation

## 2013-04-05 DIAGNOSIS — Z8659 Personal history of other mental and behavioral disorders: Secondary | ICD-10-CM | POA: Insufficient documentation

## 2013-04-05 DIAGNOSIS — Z862 Personal history of diseases of the blood and blood-forming organs and certain disorders involving the immune mechanism: Secondary | ICD-10-CM | POA: Insufficient documentation

## 2013-04-05 DIAGNOSIS — Z880 Allergy status to penicillin: Secondary | ICD-10-CM | POA: Insufficient documentation

## 2013-04-05 DIAGNOSIS — Y939 Activity, unspecified: Secondary | ICD-10-CM | POA: Insufficient documentation

## 2013-04-05 DIAGNOSIS — Y929 Unspecified place or not applicable: Secondary | ICD-10-CM | POA: Insufficient documentation

## 2013-04-05 MED ORDER — IBUPROFEN 800 MG PO TABS
ORAL_TABLET | ORAL | Status: AC
  Administered 2013-04-05: 16:00:00
  Filled 2013-04-05: qty 1

## 2013-04-05 NOTE — ED Provider Notes (Signed)
CSN: [id number]     Arrival date & time 04/05/13  1449 History   First MD Initiated Contact with Patient 04/05/13 1458     Chief Complaint  Patient presents with  . Insect Bite   (Consider location/radiation/quality/duration/timing/severity/associated sxs/prior Treatment) HPI Pt is a 27yo female c/o right neck pain after suspected insect bite 10 days ago. Reports she has noticed a small bump on the side of her neck that is tender to touch, associated with generalized headache, 8/10. Reports feels like a normal headache but more painful.  Pt states she is concerned the insect bite is infected. States she did not see the bug but thinks it was a spider.  Reports some nausea but denies fever, vomiting or diarrhea.  Has tried OTC antibiotic ointment and alcohol w/o relief.  Past Medical History  Diagnosis Date  . GERD (gastroesophageal reflux disease)   . Anxiety   . Anemia     per patient, iron-deficiency  . Stress at home    Past Surgical History  Procedure Laterality Date  . Cesarean section  2008/2012    x2  . Colonoscopy with esophagogastroduodenoscopy (egd) N/A 11/04/2012    NWG:NFAOZH mucosa in the terminal ileum Normal colon/RECTAL BLEEDING DUE TO Moderate sized internal hemorrhoids/MILD Non-erosive gastritis  . Esophagogastroduodenoscopy N/A 12/24/2012    YQM:VHQION esophagus/ LIMITED VIEW OF GASTRIC MUCOSA DUE TO RETAINED GASTRIC CONTENTS   Family History  Problem Relation Age of Onset  . Colon cancer Neg Hx    History  Substance Use Topics  . Smoking status: Current Some Day Smoker -- 0.50 packs/day for 3 years    Types: Cigarettes    Last Attempt to Quit: 05/06/2012  . Smokeless tobacco: Not on file     Comment: quit Jan 2014   . Alcohol Use: No   OB History   Grav Para Term Preterm Abortions TAB SAB Ect Mult Living                 Review of Systems  Constitutional: Negative for fever and chills.  Gastrointestinal: Negative for nausea and vomiting.   Musculoskeletal: Positive for myalgias and neck pain ( bug bite on right side of neck).  Skin: Positive for rash and wound.  All other systems reviewed and are negative.    Allergies  Bee venom and Penicillins  Home Medications   Current Outpatient Rx  Name  Route  Sig  Dispense  Refill  . levonorgestrel (MIRENA) 20 MCG/24HR IUD   Intrauterine   1 each by Intrauterine route once.          BP 136/85  Pulse 100  Temp(Src) 98 F (36.7 C) (Oral)  Resp 17  SpO2 100% Physical Exam  Nursing note and vitals reviewed. Constitutional: She is oriented to person, place, and time. She appears well-developed and well-nourished.  HENT:  Head: Normocephalic and atraumatic.  Pt has multiple pustules over face, worst in chin area with surrounding erythema but no induration, warmth, red streaking or drainage.  Eyes: EOM are normal.  Neck: Normal range of motion.  Cardiovascular: Normal rate.   Pulmonary/Chest: Effort normal.  Musculoskeletal: Normal range of motion.  Neurological: She is alert and oriented to person, place, and time.  Skin: Skin is warm and dry.  1mm pustule on right side of neck, mild erythema. Mild tenderness to palpation. No active drainage. No warmth or red streaking.  Psychiatric: She has a normal mood and affect. Her behavior is normal.    ED Course  Procedures (including critical care time) Labs Review Labs Reviewed - No data to display Imaging Review No results found.  EKG Interpretation   None       MDM   1. Insect bite    Pt c/o possible spider bite to right side of neck.  On exam, single 1mm pustule to right side of neck w/o evidence of cellulitis.  Pt also has evidence of moderate to severe acne on face, worse in chin area.  Discussed "deroofing" insect bite with 18gtt needle to help area drain.  Pt declined.  Advised pt she may also try warm compresses and gentle cleansing of the area with warm soap and water.  May take tylenol and ibuprofen  as needed for pain.  F/u with PCP in 3-4 days if not improving. Pt verbalized understanding and agreement with tx plan.    Junius Finner, PA-C 04/05/13 [phone]

## 2013-04-05 NOTE — ED Notes (Signed)
Pt felt a bite to right side neck x 10 days ago. Has small bump there now. Did not see an insect. Nad. C/o pain and headache

## 2013-04-05 NOTE — ED Notes (Signed)
Pt has a red raised area pea sized to rt side of neck for 10 days,  Has pimples to face also.  Alert, NAD.

## 2013-04-05 NOTE — ED Provider Notes (Signed)
Medical screening examination/treatment/procedure(s) were performed by non-physician practitioner and as supervising physician I was immediately available for consultation/collaboration.  EKG Interpretation   None         Glynn Octave, MD 04/05/13 1704

## 2013-04-05 NOTE — ED Notes (Signed)
Motrin given for headache.

## 2013-04-22 ENCOUNTER — Ambulatory Visit: Payer: Medicaid Other | Admitting: Gastroenterology

## 2013-05-07 NOTE — Progress Notes (Signed)
REVIEWED. Results  EGD AUG 2014 FOOD IMPACTION, NL ESO BX   AUG 2014 BPE NL ESO MO, NO STRICTURE OCT 2014 NO SHOW

## 2013-06-09 ENCOUNTER — Emergency Department (HOSPITAL_COMMUNITY): Payer: Medicaid Other

## 2013-06-09 ENCOUNTER — Encounter (HOSPITAL_COMMUNITY): Payer: Self-pay | Admitting: Emergency Medicine

## 2013-06-09 ENCOUNTER — Emergency Department (HOSPITAL_COMMUNITY)
Admission: EM | Admit: 2013-06-09 | Discharge: 2013-06-09 | Disposition: A | Discharge status: Home / Self Care | Payer: Medicaid Other | Attending: Emergency Medicine | Admitting: Emergency Medicine

## 2013-06-09 DIAGNOSIS — Y9289 Other specified places as the place of occurrence of the external cause: Secondary | ICD-10-CM | POA: Insufficient documentation

## 2013-06-09 DIAGNOSIS — Z975 Presence of (intrauterine) contraceptive device: Secondary | ICD-10-CM | POA: Insufficient documentation

## 2013-06-09 DIAGNOSIS — X500XXA Overexertion from strenuous movement or load, initial encounter: Secondary | ICD-10-CM | POA: Insufficient documentation

## 2013-06-09 DIAGNOSIS — F411 Generalized anxiety disorder: Secondary | ICD-10-CM | POA: Insufficient documentation

## 2013-06-09 DIAGNOSIS — Z3202 Encounter for pregnancy test, result negative: Secondary | ICD-10-CM | POA: Insufficient documentation

## 2013-06-09 DIAGNOSIS — F17200 Nicotine dependence, unspecified, uncomplicated: Secondary | ICD-10-CM | POA: Insufficient documentation

## 2013-06-09 DIAGNOSIS — Y9389 Activity, other specified: Secondary | ICD-10-CM | POA: Insufficient documentation

## 2013-06-09 DIAGNOSIS — Z880 Allergy status to penicillin: Secondary | ICD-10-CM | POA: Insufficient documentation

## 2013-06-09 DIAGNOSIS — Z8719 Personal history of other diseases of the digestive system: Secondary | ICD-10-CM | POA: Insufficient documentation

## 2013-06-09 DIAGNOSIS — Z862 Personal history of diseases of the blood and blood-forming organs and certain disorders involving the immune mechanism: Secondary | ICD-10-CM | POA: Insufficient documentation

## 2013-06-09 DIAGNOSIS — S39012A Strain of muscle, fascia and tendon of lower back, initial encounter: Secondary | ICD-10-CM

## 2013-06-09 DIAGNOSIS — S335XXA Sprain of ligaments of lumbar spine, initial encounter: Secondary | ICD-10-CM | POA: Insufficient documentation

## 2013-06-09 LAB — POCT PREGNANCY, URINE: Preg Test, Ur: NEGATIVE

## 2013-06-09 MED ORDER — ACETAMINOPHEN-CODEINE #3 300-30 MG PO TABS
1.0000 | ORAL_TABLET | Freq: Once | ORAL | Status: AC
  Administered 2013-06-09: 1 via ORAL
  Filled 2013-06-09: qty 1

## 2013-06-09 MED ORDER — METHOCARBAMOL 500 MG PO TABS: 500.0000 mg | ORAL_TABLET | Freq: Three times a day (TID) | ORAL | Status: DC

## 2013-06-09 MED ORDER — IBUPROFEN 600 MG PO TABS: 600.0000 mg | ORAL_TABLET | Freq: Four times a day (QID) | ORAL | Status: DC | PRN

## 2013-06-09 MED ORDER — DIAZEPAM 5 MG PO TABS
5.0000 mg | ORAL_TABLET | Freq: Once | ORAL | Status: AC
  Administered 2013-06-09: 5 mg via ORAL
  Filled 2013-06-09: qty 1

## 2013-06-09 MED ORDER — IBUPROFEN 800 MG PO TABS
800.0000 mg | ORAL_TABLET | Freq: Once | ORAL | Status: AC
  Administered 2013-06-09: 800 mg via ORAL
  Filled 2013-06-09: qty 1

## 2013-06-09 MED ORDER — ONDANSETRON HCL 4 MG PO TABS
4.0000 mg | ORAL_TABLET | Freq: Once | ORAL | Status: AC
  Administered 2013-06-09: 4 mg via ORAL
  Filled 2013-06-09: qty 1

## 2013-06-09 MED ORDER — ACETAMINOPHEN-CODEINE #3 300-30 MG PO TABS: 1.0000 | ORAL_TABLET | Freq: Four times a day (QID) | ORAL | Status: DC | PRN

## 2013-06-09 NOTE — ED Provider Notes (Signed)
CSN: [id number]     Arrival date & time 06/09/13  1601 History   First MD Initiated Contact with Patient 06/09/13 1645     Chief Complaint  Patient presents with  . Back Pain   (Consider location/radiation/quality/duration/timing/severity/associated sxs/prior Treatment) HPI Comments: Patient is a 28 year old female who presents to the emergency department with complaint of back pain. The patient states that approximately 4 days ago she was trying to catch a sixpack of soda is that was falling. In the midst T. and the sodas she felt a" pop" in her back and has been having pain since that time. The patient states she has been trying heat, Ben-Gay, and other over-the-counter remedies, but these have not been successful. The patient denies any loss of bowel or bladder function. She's not had any falls since the accident.  The history is provided by the patient.    Past Medical History  Diagnosis Date  . GERD (gastroesophageal reflux disease)   . Anxiety   . Anemia     per patient, iron-deficiency  . Stress at home    Past Surgical History  Procedure Laterality Date  . Cesarean section  2008/2012    x2  . Colonoscopy with esophagogastroduodenoscopy (egd) N/A 11/04/2012    OZH:YQMVHQ mucosa in the terminal ileum Normal colon/RECTAL BLEEDING DUE TO Moderate sized internal hemorrhoids/MILD Non-erosive gastritis  . Esophagogastroduodenoscopy N/A 12/24/2012    ION:GEXBMW esophagus/ LIMITED VIEW OF GASTRIC MUCOSA DUE TO RETAINED GASTRIC CONTENTS   Family History  Problem Relation Age of Onset  . Colon cancer Neg Hx    History  Substance Use Topics  . Smoking status: Current Some Day Smoker -- 0.50 packs/day for 3 years    Types: Cigarettes    Last Attempt to Quit: 05/06/2012  . Smokeless tobacco: Not on file     Comment: quit Jan 2014   . Alcohol Use: No   OB History   Grav Para Term Preterm Abortions TAB SAB Ect Mult Living                 Review of Systems  Constitutional:  Negative for activity change.       All ROS Neg except as noted in HPI  HENT: Negative for nosebleeds.   Eyes: Negative for photophobia and discharge.  Respiratory: Negative for cough, shortness of breath and wheezing.   Cardiovascular: Negative for chest pain and palpitations.  Gastrointestinal: Negative for abdominal pain and blood in stool.  Genitourinary: Negative for dysuria, frequency and hematuria.  Musculoskeletal: Positive for back pain. Negative for arthralgias and neck pain.  Skin: Negative.   Neurological: Negative for dizziness, seizures and speech difficulty.  Psychiatric/Behavioral: Negative for hallucinations and confusion. The patient is nervous/anxious.     Allergies  Bee venom and Penicillins  Home Medications   Current Outpatient Rx  Name  Route  Sig  Dispense  Refill  . levonorgestrel (MIRENA) 20 MCG/24HR IUD   Intrauterine   1 each by Intrauterine route once.          BP 128/86  Pulse 114  Temp(Src) 98 F (36.7 C) (Oral)  Resp 18  Ht 5\' 1"  (1.549 m)  Wt 100 lb (45.36 kg)  BMI 18.90 kg/m2  SpO2 100% Physical Exam  Nursing note and vitals reviewed. Constitutional: She is oriented to person, place, and time. She appears well-developed and well-nourished.  Non-toxic appearance.  HENT:  Head: Normocephalic.  Right Ear: Tympanic membrane and external ear normal.  Left Ear: Tympanic membrane  and external ear normal.  Eyes: EOM and lids are normal. Pupils are equal, round, and reactive to light.  Neck: Normal range of motion. Neck supple. Carotid bruit is not present.  Cardiovascular: Normal rate, regular rhythm, normal heart sounds, intact distal pulses and normal pulses.   Pulmonary/Chest: Breath sounds normal. No respiratory distress.  Abdominal: Soft. Bowel sounds are normal. There is no tenderness. There is no guarding.  Musculoskeletal: Normal range of motion.  There is pain to palpation and attempted range of motion in the paraspinal area of the  lumbar region. There is some spasm appreciated on palpation. There is no palpable step off of the lumbar spine area. There no hot areas appreciated.  Lymphadenopathy:       Head (right side): No submandibular adenopathy present.       Head (left side): No submandibular adenopathy present.    She has no cervical adenopathy.  Neurological: She is alert and oriented to person, place, and time. She has normal strength. No cranial nerve deficit or sensory deficit.  Gait is slow but steady. There no gross neurologic deficits appreciated.  Skin: Skin is warm and dry.  Psychiatric: She has a normal mood and affect. Her speech is normal.  Patient is anxious    ED Course  Procedures (including critical care time) Labs Review Labs Reviewed  POCT PREGNANCY, URINE   Imaging Review Dg Lumbar Spine Complete  06/09/2013   CLINICAL DATA:  Low back pain  EXAM: LUMBAR SPINE - COMPLETE 4+ VIEW  COMPARISON:  None.  FINDINGS: There is no evidence of lumbar spine fracture. There is scoliosis of spine. IUD is identified. Intervertebral disc spaces are maintained.  IMPRESSION: No acute fracture or dislocation.  Scoliosis of spine.   Electronically Signed   By: Sherian ReinWei-Chen  Lin M.D.   On: 06/09/2013 17:01    EKG Interpretation   None       MDM  No diagnosis found. **I have reviewed nursing notes, vital signs, and all appropriate lab and imaging results for this patient.*  The patient was attempting to catch a 6 pack of falling sodas approximately 4 days ago when she injured her lower back. She has tried over-the-counter medications and heat without success. X-ray of the lumbar spine is negative for any fracture or subluxation. There is noted some scoliosis present.  The plan at this time is for the patient to have a prescription for Tylenol with codeine, Robaxin, and ibuprofen 600 mg. Patient is advised to see her primary physician (Dr. Ledell PeoplesMuse) as Ann Makisone as possible for followup and recheck.  Kathie DikeHobson M Alaylah Heatherington,  PA-C 06/09/13 1734

## 2013-06-09 NOTE — ED Notes (Signed)
Pt reports has had pain in lower back since trying to catch a case of soda that was falling off a shelf 4 days ago.

## 2013-06-09 NOTE — Discharge Instructions (Signed)
Your x-ray is negative for fracture or dislocation. Your examination does not reveal any gross neurologic deficit at this time. Please use heat to your back and rest her back is much as possible daily. Please use ibuprofen for inflammation and swelling. Please use Robaxin for spasm pain. Please use Tylenol codeine for pain if needed. Tylenol codeine and Robaxin may cause drowsiness, please use with caution, please take these medications with food. Please see Dr. Ledell PeoplesMuse for office followup appointment as sone as possible. Back Pain, Adult Low back pain is very common. About 1 in 5 people have back pain.The cause of low back pain is rarely dangerous. The pain often gets better over time.About half of people with a sudden onset of back pain feel better in just 2 weeks. About 8 in 10 people feel better by 6 weeks.  CAUSES Some common causes of back pain include:  Strain of the muscles or ligaments supporting the spine.  Wear and tear (degeneration) of the spinal discs.  Arthritis.  Direct injury to the back. DIAGNOSIS Most of the time, the direct cause of low back pain is not known.However, back pain can be treated effectively even when the exact cause of the pain is unknown.Answering your caregiver's questions about your overall health and symptoms is one of the most accurate ways to make sure the cause of your pain is not dangerous. If your caregiver needs more information, he or she may order lab work or imaging tests (X-rays or MRIs).However, even if imaging tests show changes in your back, this usually does not require surgery. HOME CARE INSTRUCTIONS For many people, back pain returns.Since low back pain is rarely dangerous, it is often a condition that people can learn to [facility] their own.   Remain active. It is stressful on the back to sit or stand in one place. Do not sit, drive, or stand in one place for more than 30 minutes at a time. Take short walks on level surfaces as soon as pain  allows.Try to increase the length of time you walk each day.  Do not stay in bed.Resting more than 1 or 2 days can delay your recovery.  Do not avoid exercise or work.Your body is made to move.It is not dangerous to be active, even though your back may hurt.Your back will likely heal faster if you return to being active before your pain is gone.  Pay attention to your body when you bend and lift. Many people have less discomfortwhen lifting if they bend their knees, keep the load close to their bodies,and avoid twisting. Often, the most comfortable positions are those that put less stress on your recovering back.  Find a comfortable position to sleep. Use a firm mattress and lie on your side with your knees slightly bent. If you lie on your back, put a pillow under your knees.  Only take over-the-counter or prescription medicines as directed by your caregiver. Over-the-counter medicines to reduce pain and inflammation are often the most helpful.Your caregiver may prescribe muscle relaxant drugs.These medicines help dull your pain so you can more quickly return to your normal activities and healthy exercise.  Put ice on the injured area.  Put ice in a plastic bag.  Place a towel between your skin and the bag.  Leave the ice on for 15-20 minutes, 03-04 times a day for the first 2 to 3 days. After that, ice and heat may be alternated to reduce pain and spasms.  Ask your caregiver about trying  back exercises and gentle massage. This may be of some benefit.  Avoid feeling anxious or stressed.Stress increases muscle tension and can worsen back pain.It is important to recognize when you are anxious or stressed and learn ways to manage it.Exercise is a great option. SEEK MEDICAL CARE IF:  You have pain that is not relieved with rest or medicine.  You have pain that does not improve in 1 week.  You have new symptoms.  You are generally not feeling well. SEEK IMMEDIATE MEDICAL CARE  IF:   You have pain that radiates from your back into your legs.  You develop new bowel or bladder control problems.  You have unusual weakness or numbness in your arms or legs.  You develop nausea or vomiting.  You develop abdominal pain.  You feel faint. Document Released: 04/22/2005 Document Revised: 10/22/2011 Document Reviewed: 09/10/2010 [facility] Patient Information 2014 [place], [place].

## 2013-06-09 NOTE — ED Provider Notes (Signed)
Medical screening examination/treatment/procedure(s) were performed by non-physician practitioner and as supervising physician I was immediately available for consultation/collaboration.  EKG Interpretation   None         Marigold Mom L Hannahmarie Asberry, MD 06/09/13 2228 

## 2014-08-30 ENCOUNTER — Emergency Department (HOSPITAL_COMMUNITY): Payer: Medicaid Other

## 2014-08-30 ENCOUNTER — Emergency Department (HOSPITAL_COMMUNITY)
Admission: EM | Admit: 2014-08-30 | Discharge: 2014-08-30 | Disposition: A | Discharge status: Home / Self Care | Payer: Medicaid Other | Attending: Emergency Medicine | Admitting: Emergency Medicine

## 2014-08-30 ENCOUNTER — Encounter (HOSPITAL_COMMUNITY): Payer: Self-pay | Admitting: Emergency Medicine

## 2014-08-30 DIAGNOSIS — K2970 Gastritis, unspecified, without bleeding: Secondary | ICD-10-CM | POA: Insufficient documentation

## 2014-08-30 DIAGNOSIS — Z79899 Other long term (current) drug therapy: Secondary | ICD-10-CM | POA: Diagnosis not present

## 2014-08-30 DIAGNOSIS — R1013 Epigastric pain: Secondary | ICD-10-CM | POA: Diagnosis present

## 2014-08-30 DIAGNOSIS — Z3202 Encounter for pregnancy test, result negative: Secondary | ICD-10-CM | POA: Diagnosis not present

## 2014-08-30 DIAGNOSIS — Z87891 Personal history of nicotine dependence: Secondary | ICD-10-CM | POA: Diagnosis not present

## 2014-08-30 DIAGNOSIS — Z8659 Personal history of other mental and behavioral disorders: Secondary | ICD-10-CM | POA: Insufficient documentation

## 2014-08-30 DIAGNOSIS — Z880 Allergy status to penicillin: Secondary | ICD-10-CM | POA: Insufficient documentation

## 2014-08-30 DIAGNOSIS — Z862 Personal history of diseases of the blood and blood-forming organs and certain disorders involving the immune mechanism: Secondary | ICD-10-CM | POA: Insufficient documentation

## 2014-08-30 LAB — BASIC METABOLIC PANEL
Anion gap: 7 (ref 5–15)
BUN: 11 mg/dL (ref 6–23)
CHLORIDE: 104 mmol/L (ref 96–112)
CO2: 29 mmol/L (ref 19–32)
CREATININE: 0.98 mg/dL (ref 0.50–1.10)
Calcium: 9.4 mg/dL (ref 8.4–10.5)
GFR calc Af Amer: >90 mL/min (ref >90)
GFR calc non Af Amer: 78 mL/min — ABNORMAL LOW (ref >90)
Glucose, Bld: 95 mg/dL (ref 70–99)
Potassium: 4.5 mmol/L (ref 3.5–5.1)
SODIUM: 140 mmol/L (ref 135–145)

## 2014-08-30 LAB — HEPATIC FUNCTION PANEL
ALK PHOS: 48 U/L (ref 39–117)
ALT: 13 U/L (ref 0–35)
AST: 17 U/L (ref 0–37)
Albumin: 4.4 g/dL (ref 3.5–5.2)
Total Bilirubin: 0.7 mg/dL (ref 0.3–1.2)
Total Protein: 7.2 g/dL (ref 6.0–8.3)

## 2014-08-30 LAB — CBC WITH DIFFERENTIAL/PLATELET
BASOS ABS: 0 10*3/uL (ref 0.0–0.1)
BASOS PCT: 0 % (ref 0–1)
Eosinophils Absolute: 0.1 10*3/uL (ref 0.0–0.7)
Eosinophils Relative: 1 % (ref 0–5)
HCT: 42 % (ref 36.0–46.0)
Hemoglobin: 13.8 g/dL (ref 12.0–15.0)
LYMPHS ABS: 1.4 10*3/uL (ref 0.7–4.0)
Lymphocytes Relative: 29 % (ref 12–46)
MCH: 29.2 pg (ref 26.0–34.0)
MCHC: 32.9 g/dL (ref 30.0–36.0)
MCV: 89 fL (ref 78.0–100.0)
Monocytes Absolute: 0.5 10*3/uL (ref 0.1–1.0)
Monocytes Relative: 10 % (ref 3–12)
NEUTROS PCT: 60 % (ref 43–77)
Neutro Abs: 3 10*3/uL (ref 1.7–7.7)
Platelets: 190 10*3/uL (ref 150–400)
RBC: 4.72 MIL/uL (ref 3.87–5.11)
RDW: 11.8 % (ref 11.5–15.5)
WBC: 5 10*3/uL (ref 4.0–10.5)

## 2014-08-30 LAB — URINE MICROSCOPIC-ADD ON

## 2014-08-30 LAB — URINALYSIS, ROUTINE W REFLEX MICROSCOPIC
Bilirubin Urine: NEGATIVE
GLUCOSE, UA: NEGATIVE mg/dL
Hgb urine dipstick: NEGATIVE
KETONES UR: NEGATIVE mg/dL
Leukocytes, UA: NEGATIVE
Nitrite: NEGATIVE
PH: 7.5 (ref 5.0–8.0)
Protein, ur: 30 mg/dL — AB
SPECIFIC GRAVITY, URINE: 1.015 (ref 1.005–1.030)
Urobilinogen, UA: 0.2 mg/dL (ref 0.0–1.0)

## 2014-08-30 LAB — POC OCCULT BLOOD, ED: Fecal Occult Bld: NEGATIVE

## 2014-08-30 LAB — LIPASE, BLOOD: Lipase: 28 U/L (ref 11–59)

## 2014-08-30 LAB — POC URINE PREG, ED: PREG TEST UR: NEGATIVE

## 2014-08-30 MED ORDER — ONDANSETRON 4 MG PO TBDP: 4.0000 mg | ORAL_TABLET | Freq: Three times a day (TID) | ORAL | Status: DC | PRN

## 2014-08-30 MED ORDER — PANTOPRAZOLE SODIUM 40 MG PO TBEC
40.0000 mg | DELAYED_RELEASE_TABLET | Freq: Once | ORAL | Status: AC
  Administered 2014-08-30: 40 mg via ORAL
  Filled 2014-08-30: qty 1

## 2014-08-30 MED ORDER — SODIUM CHLORIDE 0.9 % IV SOLN
INTRAVENOUS | Status: DC
Start: 2014-08-30 — End: 2014-08-30

## 2014-08-30 MED ORDER — HYDROCODONE-ACETAMINOPHEN 5-325 MG PO TABS: 1.0000 | ORAL_TABLET | ORAL | Status: DC | PRN

## 2014-08-30 MED ORDER — SODIUM CHLORIDE 0.9 % IV SOLN
80.0000 mg | Freq: Once | INTRAVENOUS | Status: DC
  Filled 2014-08-30: qty 80

## 2014-08-30 MED ORDER — PANTOPRAZOLE SODIUM 40 MG PO TBEC: 40.0000 mg | DELAYED_RELEASE_TABLET | Freq: Every day | ORAL | Status: DC

## 2014-08-30 MED ORDER — PANTOPRAZOLE SODIUM 40 MG IV SOLR: 40.0000 mg | Freq: Two times a day (BID) | INTRAVENOUS | Status: DC

## 2014-08-30 MED ORDER — SODIUM CHLORIDE 0.9 % IV BOLUS (SEPSIS)
1000.0000 mL | Freq: Once | INTRAVENOUS | Status: AC
  Administered 2014-08-30: 1000 mL via INTRAVENOUS

## 2014-08-30 MED ORDER — SODIUM CHLORIDE 0.9 % IV SOLN
8.0000 mg/h | INTRAVENOUS | Status: DC
  Filled 2014-08-30 (×3): qty 80

## 2014-08-30 MED ORDER — FENTANYL CITRATE (PF) 100 MCG/2ML IJ SOLN
50.0000 ug | Freq: Once | INTRAMUSCULAR | Status: AC
  Administered 2014-08-30: 50 ug via INTRAVENOUS
  Filled 2014-08-30: qty 2

## 2014-08-30 MED ORDER — ONDANSETRON HCL 4 MG/2ML IJ SOLN
4.0000 mg | Freq: Once | INTRAMUSCULAR | Status: AC
  Administered 2014-08-30: 4 mg via INTRAVENOUS
  Filled 2014-08-30: qty 2

## 2014-08-30 MED ORDER — GI COCKTAIL ~~LOC~~
30.0000 mL | Freq: Once | ORAL | Status: AC
  Administered 2014-08-30: 30 mL via ORAL

## 2014-08-30 NOTE — Discharge Instructions (Signed)
I recommend you avoid NSAIDs such as aspirin, Aleve, Motrin/ibuprofen. Please also avoid alcohol. If your symptoms continue please follow-up with your gastroenterologist.   Gastritis, Adult Gastritis is soreness and swelling (inflammation) of the lining of the stomach. Gastritis can develop as a sudden onset (acute) or long-term (chronic) condition. If gastritis is not treated, it can lead to stomach bleeding and ulcers. CAUSES  Gastritis occurs when the stomach lining is weak or damaged. Digestive juices from the stomach then inflame the weakened stomach lining. The stomach lining may be weak or damaged due to viral or bacterial infections. One common bacterial infection is the Helicobacter pylori infection. Gastritis can also result from excessive alcohol consumption, taking certain medicines, or having too much acid in the stomach.  SYMPTOMS  In some cases, there are no symptoms. When symptoms are present, they may include:  Pain or a burning sensation in the upper abdomen.  Nausea.  Vomiting.  An uncomfortable feeling of fullness after eating. DIAGNOSIS  Your caregiver may suspect you have gastritis based on your symptoms and a physical exam. To determine the cause of your gastritis, your caregiver may perform the following:  Blood or stool tests to check for the H pylori bacterium.  Gastroscopy. A thin, flexible tube (endoscope) is passed down the esophagus and into the stomach. The endoscope has a light and camera on the end. Your caregiver uses the endoscope to view the inside of the stomach.  Taking a tissue sample (biopsy) from the stomach to examine under a microscope. TREATMENT  Depending on the cause of your gastritis, medicines may be prescribed. If you have a bacterial infection, such as an H pylori infection, antibiotics may be given. If your gastritis is caused by too much acid in the stomach, H2 blockers or antacids may be given. Your caregiver may recommend that you stop  taking aspirin, ibuprofen, or other nonsteroidal anti-inflammatory drugs (NSAIDs). HOME CARE INSTRUCTIONS  Only take over-the-counter or prescription medicines as directed by your caregiver.  If you were given antibiotic medicines, take them as directed. Finish them even if you start to feel better.  Drink enough fluids to keep your urine clear or pale yellow.  Avoid foods and drinks that make your symptoms worse, such as:  Caffeine or alcoholic drinks.  Chocolate.  Peppermint or mint flavorings.  Garlic and onions.  Spicy foods.  Citrus fruits, such as oranges, lemons, or limes.  Tomato-based foods such as sauce, chili, salsa, and pizza.  Fried and fatty foods.  Eat small, frequent meals instead of large meals. SEEK IMMEDIATE MEDICAL CARE IF:   You have black or dark red stools.  You vomit blood or material that looks like coffee grounds.  You are unable to keep fluids down.  Your abdominal pain gets worse.  You have a fever.  You do not feel better after 1 week.  You have any other questions or concerns. MAKE SURE YOU:  Understand these instructions.  Will watch your condition.  Will get help right away if you are not doing well or get worse. Document Released: 04/16/2001 Document Revised: 10/22/2011 Document Reviewed: 06/05/2011 [facility] Patient Information 2015 [place], [place]. This information is not intended to replace advice given to you by your health care provider. Make sure you discuss any questions you have with your health care provider.

## 2014-08-30 NOTE — ED Notes (Signed)
Patient states she was given clindamycin by her dentist on 04/21 and started having abdominal pain, nausea, and black stools. States she was told to come to ER by her doctor. States she last took medicine on Saturday.

## 2014-08-30 NOTE — ED Notes (Signed)
Presents to ER with c/o taking antibiotic that dentist ordered 5 days ago. States she doesn't know the name of the medication however the med makes her stomach hurt and she had 1 black stoll yesterday and 1 today.

## 2014-08-30 NOTE — ED Provider Notes (Signed)
TIME SEEN: 2:47 PM  CHIEF COMPLAINT: Abdominal pain, nausea, black stools  HPI: Pt is a 29 y.o. female with history of GERD who presents to the emergency department with complaints of epigastric abdominal pain, nausea and black stools since April 21. Reports she was seen by her dentist and started on clindamycin on April 21. Shortly after taking this medication she began having abdominal pain and nausea. Reports she had an episode of black stools yesterday one time and then again one time today. Denies a history of prior GI bleed, gastric ulcer. Has had an EGD and colonoscopy in 2014. It appears at that time she had nonerosive gastritis and internal hemorrhoids. Denies any fever. No vomiting or diarrhea. She has a Mirena IUD and has not had a menstrual period in several years. No sick contacts or recent travel. Not on anticoagulation. Has not recently used iron tablets are Pepto-Bismol. Her gastroenterologist is Dr. Darrick PennaFields. [facility]. She has had 2 prior C-sections.  ROS: See HPI Constitutional: no fever  Eyes: no drainage  ENT: no runny nose   Cardiovascular:  no chest pain  Resp: no SOB  GI: no vomiting GU: no dysuria Integumentary: no rash  Allergy: no hives  Musculoskeletal: no leg swelling  Neurological: no slurred speech ROS otherwise negative  PAST MEDICAL HISTORY/PAST SURGICAL HISTORY:  Past Medical History  Diagnosis Date  . GERD (gastroesophageal reflux disease)   . Anxiety   . Anemia     per patient, iron-deficiency  . Stress at home     MEDICATIONS:  Prior to Admission medications   Medication Sig Start Date End Date Taking? Authorizing Provider  acetaminophen-codeine (TYLENOL #3) 300-30 MG per tablet Take 1-2 tablets by mouth every 6 (six) hours as needed for moderate pain. 06/09/13   Ivery QualeHobson Bryant, PA-C  ibuprofen (ADVIL,MOTRIN) 600 MG tablet Take 1 tablet (600 mg total) by mouth every 6 (six) hours as needed. 06/09/13   Ivery QualeHobson  Bryant, PA-C  levonorgestrel (MIRENA) 20 MCG/24HR IUD 1 each by Intrauterine route once.    Historical Provider, MD  methocarbamol (ROBAXIN) 500 MG tablet Take 1 tablet (500 mg total) by mouth 3 (three) times daily. 06/09/13   Ivery QualeHobson Bryant, PA-C    ALLERGIES:  Allergies  Allergen Reactions  . Bee Venom Swelling  . Penicillins Other (See Comments)    REACTION: Unknown    SOCIAL HISTORY:  History  Substance Use Topics  . Smoking status: Former Smoker -- 0.50 packs/day for 3 years    Types: Cigarettes    Quit date: 05/06/2012  . Smokeless tobacco: Not on file     Comment: quit Jan 2014   . Alcohol Use: No    FAMILY HISTORY: Family History  Problem Relation Age of Onset  . Colon cancer Neg Hx     EXAM: BP 112/78 mmHg  Pulse 104  Temp(Src) 98 F (36.7 C) (Oral)  Resp 16  Ht 5\' 1"  (1.549 m)  Wt 100 lb (45.36 kg)  BMI 18.90 kg/m2  SpO2 100% CONSTITUTIONAL: Alert and oriented and responds appropriately to questions. Well-appearing; well-nourished, nontoxic, well-hydrated HEAD: Normocephalic EYES: Conjunctivae clear, PERRL ENT: normal nose; no rhinorrhea; moist mucous membranes; pharynx without lesions noted NECK: Supple, no meningismus, no LAD  CARD: RRR; S1 and S2 appreciated; no murmurs, no clicks, no rubs, no gallops RESP: Normal chest excursion without splinting or tachypnea; breath sounds clear and equal bilaterally; no wheezes, no rhonchi, no rales ABD/GI: Normal bowel sounds;  non-distended; soft, tender to palpation in the epigastric region without guarding or rebound, negative Murphy sign, no tenderness at McBurney's point RECTAL:  Normal rectal tone, no gross blood, patient stools are black appearing but they are guaiac negative BACK:  The back appears normal and is non-tender to palpation, there is no CVA tenderness EXT: Normal ROM in all joints; non-tender to palpation; no edema; normal capillary refill; no cyanosis    SKIN: Normal color for age and race;  warm NEURO: Moves all extremities equally PSYCH: The patient's mood and manner are appropriate. Grooming and personal hygiene are appropriate.  MEDICAL DECISION MAKING: Patient here with epigastric abdominal pain and black stools. She is guaiac-negative however. Unclear etiology for patient's black stools but does not appear to be having any bleeding. She has a history of nonerosive gastritis which I feel is likely the cause of her symptoms. We'll give Protonix, GI cocktail. Abdominal exam is relatively benign and is a nonsurgical abdomen. I do not feel she needs emergent imaging. Abdominal labs pending.  ED PROGRESS: Patient's labs are unremarkable with no leukocytosis, normal LFTs and lipase. She is not pregnant. Urine shows no sign of infection. Reports feeling better after fentanyl, Zofran, Protonix and GI cocktail. Suspect that this is secondary to gastritis. We'll discharge home with prescription for Protonix, Vicodin, Zofran. Have advised her to avoid NSAIDs and alcohol. Have advised her if symptoms continue to follow-up with Dr. Darrick Penna. Discussed return precautions. She verbalized understanding and is comfortable with plan.     Layla Maw Raysa Bosak, DO 08/30/14 1701

## 2015-01-25 ENCOUNTER — Encounter (HOSPITAL_COMMUNITY): Payer: Self-pay | Admitting: Emergency Medicine

## 2015-01-25 ENCOUNTER — Emergency Department (HOSPITAL_COMMUNITY): Payer: Medicaid Other

## 2015-01-25 ENCOUNTER — Emergency Department (HOSPITAL_COMMUNITY)
Admission: EM | Admit: 2015-01-25 | Discharge: 2015-01-25 | Disposition: A | Discharge status: Home / Self Care | Payer: Medicaid Other | Attending: Emergency Medicine | Admitting: Emergency Medicine

## 2015-01-25 ENCOUNTER — Telehealth: Payer: Self-pay | Admitting: Orthopedic Surgery

## 2015-01-25 DIAGNOSIS — Z87891 Personal history of nicotine dependence: Secondary | ICD-10-CM | POA: Diagnosis not present

## 2015-01-25 DIAGNOSIS — Z79899 Other long term (current) drug therapy: Secondary | ICD-10-CM | POA: Diagnosis not present

## 2015-01-25 DIAGNOSIS — Z862 Personal history of diseases of the blood and blood-forming organs and certain disorders involving the immune mechanism: Secondary | ICD-10-CM | POA: Insufficient documentation

## 2015-01-25 DIAGNOSIS — Z3202 Encounter for pregnancy test, result negative: Secondary | ICD-10-CM | POA: Diagnosis not present

## 2015-01-25 DIAGNOSIS — Z880 Allergy status to penicillin: Secondary | ICD-10-CM | POA: Diagnosis not present

## 2015-01-25 DIAGNOSIS — M545 Low back pain, unspecified: Secondary | ICD-10-CM

## 2015-01-25 DIAGNOSIS — K219 Gastro-esophageal reflux disease without esophagitis: Secondary | ICD-10-CM | POA: Diagnosis not present

## 2015-01-25 LAB — URINALYSIS, ROUTINE W REFLEX MICROSCOPIC
Bilirubin Urine: NEGATIVE
Glucose, UA: NEGATIVE mg/dL
Hgb urine dipstick: NEGATIVE
KETONES UR: NEGATIVE mg/dL
Leukocytes, UA: NEGATIVE
NITRITE: NEGATIVE
Protein, ur: NEGATIVE mg/dL
Specific Gravity, Urine: 1.01 (ref 1.005–1.030)
Urobilinogen, UA: 0.2 mg/dL (ref 0.0–1.0)
pH: 6 (ref 5.0–8.0)

## 2015-01-25 LAB — POC URINE PREG, ED: Preg Test, Ur: NEGATIVE

## 2015-01-25 MED ORDER — HYDROCODONE-ACETAMINOPHEN 5-325 MG PO TABS: ORAL_TABLET | ORAL | Status: DC

## 2015-01-25 MED ORDER — METHOCARBAMOL 500 MG PO TABS: 500.0000 mg | ORAL_TABLET | Freq: Three times a day (TID) | ORAL | Status: DC

## 2015-01-25 MED ORDER — CYCLOBENZAPRINE HCL 10 MG PO TABS
10.0000 mg | ORAL_TABLET | Freq: Once | ORAL | Status: AC
  Administered 2015-01-25: 10 mg via ORAL
  Filled 2015-01-25: qty 1

## 2015-01-25 MED ORDER — DICLOFENAC SODIUM 75 MG PO TBEC: 75.0000 mg | DELAYED_RELEASE_TABLET | Freq: Two times a day (BID) | ORAL | Status: DC

## 2015-01-25 MED ORDER — HYDROCODONE-ACETAMINOPHEN 5-325 MG PO TABS
1.0000 | ORAL_TABLET | Freq: Once | ORAL | Status: AC
  Administered 2015-01-25: 1 via ORAL
  Filled 2015-01-25: qty 1

## 2015-01-25 NOTE — Telephone Encounter (Signed)
Call from patient following her visit to [facility] Emergency Room today for low back pain. Reviewed schedule and insurance; relayed we can schedule appointment upon receipt of referral from primary care provider, per her insurance requirement. Said okay, will contact.

## 2015-01-25 NOTE — ED Notes (Signed)
Pt made aware to return if symptoms worsen or if any life threatening symptoms occur.   

## 2015-01-25 NOTE — ED Notes (Signed)
Having back pain to lower pain.  Rates pain 9/10.  No pain medication this am.

## 2015-01-25 NOTE — Discharge Instructions (Signed)
Back Pain, Adult °Back pain is very common. The pain often gets better over time. The cause of back pain is usually not dangerous. Most people can learn to manage their back pain on their own.  °HOME CARE  °· Stay active. Start with short walks on flat ground if you can. Try to walk farther each day. °· Do not sit, drive, or stand in one place for more than 30 minutes. Do not stay in bed. °· Do not avoid exercise or work. Activity can help your back heal faster. °· Be careful when you bend or lift an object. Bend at your knees, keep the object close to you, and do not twist. °· Sleep on a firm mattress. Lie on your side, and bend your knees. If you lie on your back, put a pillow under your knees. °· Only take medicines as told by your doctor. °· Put ice on the injured area. °¨ Put ice in a plastic bag. °¨ Place a towel between your skin and the bag. °¨ Leave the ice on for 15-20 minutes, 03-04 times a day for the first 2 to 3 days. After that, you can switch between ice and heat packs. °· Ask your doctor about back exercises or massage. °· Avoid feeling anxious or stressed. Find good ways to deal with stress, such as exercise. °GET HELP RIGHT AWAY IF:  °· Your pain does not go away with rest or medicine. °· Your pain does not go away in 1 week. °· You have new problems. °· You do not feel well. °· The pain spreads into your legs. °· You cannot control when you poop (bowel movement) or pee (urinate). °· Your arms or legs feel weak or lose feeling (numbness). °· You feel sick to your stomach (nauseous) or throw up (vomit). °· You have belly (abdominal) pain. °· You feel like you may pass out (faint). °MAKE SURE YOU:  °· Understand these instructions. °· Will watch your condition. °· Will get help right away if you are not doing well or get worse. °Document Released: 10/09/2007 Document Revised: 07/15/2011 Document Reviewed: 08/24/2013 °[employer]® Patient Information ©2015 [employer]. This information is not intended  to replace advice given to you by your health care provider. Make sure you discuss any questions you have with your health care provider. ° °

## 2015-01-28 NOTE — ED Provider Notes (Signed)
CSN: [id number]     Arrival date & time 01/25/15  1220 History   First MD Initiated Contact with Patient 01/25/15 1255     Chief Complaint  Patient presents with  . Back Pain     (Consider location/radiation/quality/duration/timing/severity/associated sxs/prior Treatment) HPI  [name] [name] is a 29 y.o. female who presents to the Emergency Department complaining of low back pain for several days.  She describes the pain as aching and sharp with movement.  Pain radiates across her lower back and its worse with movement.  Improves somewhat with rest.  She has not tried any medications for her symptoms.  She denies known injury, abd pain, numbness or weakness to her lower extremities, urine or bowel incontinence and or retention.     Past Medical History  Diagnosis Date  . GERD (gastroesophageal reflux disease)   . Anxiety   . Anemia     per patient, iron-deficiency  . Stress at home    Past Surgical History  Procedure Laterality Date  . Cesarean section  2008/2012    x2  . Colonoscopy with esophagogastroduodenoscopy (egd) N/A 11/04/2012    OZH:YQMVHQ mucosa in the terminal ileum Normal colon/RECTAL BLEEDING DUE TO Moderate sized internal hemorrhoids/MILD Non-erosive gastritis  . Esophagogastroduodenoscopy N/A 12/24/2012    ION:GEXBMW esophagus/ LIMITED VIEW OF GASTRIC MUCOSA DUE TO RETAINED GASTRIC CONTENTS   Family History  Problem Relation Age of Onset  . Colon cancer Neg Hx    Social History  Substance Use Topics  . Smoking status: Former Smoker -- 0.50 packs/day for 3 years    Types: Cigarettes    Quit date: 05/06/2012  . Smokeless tobacco: None     Comment: quit Jan 2014   . Alcohol Use: No   OB History    No data available     Review of Systems  Constitutional: Negative for fever.  Respiratory: Negative for shortness of breath.   Gastrointestinal: Negative for vomiting, abdominal pain and constipation.  Genitourinary: Negative for dysuria, hematuria, flank  pain, decreased urine volume and difficulty urinating.  Musculoskeletal: Positive for back pain. Negative for joint swelling.  Skin: Negative for rash.  Neurological: Negative for weakness and numbness.  All other systems reviewed and are negative.     Allergies  Bee venom and Penicillins  Home Medications   Prior to Admission medications   Medication Sig Start Date End Date Taking? Authorizing [place] Gouge  diclofenac (VOLTAREN) 75 MG EC tablet Take 1 tablet (75 mg total) by mouth 2 (two) times daily. Take with food 01/25/15   Tammy Triplett, PA-C  HYDROcodone-acetaminophen (NORCO/VICODIN) 5-325 MG per tablet Take one tab po q 4-6 hrs prn pain 01/25/15   Tammy Triplett, PA-C  levonorgestrel (MIRENA) 20 MCG/24HR IUD 1 each by Intrauterine route once.    Historical Delrae Hagey, MD  methocarbamol (ROBAXIN) 500 MG tablet Take 1 tablet (500 mg total) by mouth 3 (three) times daily. 01/25/15   Tammy Triplett, PA-C  ondansetron (ZOFRAN ODT) 4 MG disintegrating tablet Take 1 tablet (4 mg total) by mouth every 8 (eight) hours as needed for nausea or vomiting. 08/30/14   Kristen N Ward, DO  pantoprazole (PROTONIX) 40 MG tablet Take 1 tablet (40 mg total) by mouth daily. 08/30/14   Kristen N Ward, DO   BP 117/78 mmHg  Pulse 85  Temp(Src) 98.5 F (36.9 C) (Oral)  Resp 16  Ht 5' (1.524 m)  Wt 115 lb (52.164 kg)  BMI 22.46 kg/m2  SpO2 100% Physical Exam  Constitutional: She is oriented to person, place, and time. She appears well-developed and well-nourished. No distress.  HENT:  Head: Normocephalic and atraumatic.  Neck: Normal range of motion. Neck supple.  Cardiovascular: Normal rate, regular rhythm, normal heart sounds and intact distal pulses.   No murmur heard. Pulmonary/Chest: Effort normal and breath sounds normal. No respiratory distress.  Abdominal: Soft. She exhibits no distension. There is no tenderness. There is no rebound and no guarding.  Musculoskeletal: She exhibits tenderness. She  exhibits no edema.       Lumbar back: She exhibits tenderness and pain. She exhibits normal range of motion, no swelling, no deformity, no laceration and normal pulse.  ttp of the lower lumbar spine muscles.  DP pulses are brisk and symmetrical.  Distal sensation intact.  Hip Flexors/Extensors are intact.  Pt has 5/5 strength against resistance of bilateral lower extremities.     Neurological: She is alert and oriented to person, place, and time. She has normal strength. No sensory deficit. She exhibits normal muscle tone. Coordination and gait normal.  Reflex Scores:      Patellar reflexes are 2+ on the right side and 2+ on the left side.      Achilles reflexes are 2+ on the right side and 2+ on the left side. Skin: Skin is warm and dry. No rash noted.  Psychiatric: She has a normal mood and affect.  Nursing note and vitals reviewed.   ED Course  Procedures (including critical care time) Labs Review Labs Reviewed  URINALYSIS, ROUTINE W REFLEX MICROSCOPIC (NOT AT [facility])  POC URINE PREG, ED    Imaging Review No results found. I have personally reviewed and evaluated these images and lab results as part of my medical decision-making.   EKG Interpretation None      MDM   Final diagnoses:  Midline low back pain without sciatica    Pt is well appearing.  Vitals stable.  No focal neuro deficits, no concerning sx's for emergent neurological or infectious process.  Likely musculoskeletal.  Pt agrees to close PMD f/u and symptomatic tx,  She appears stable for d/c    Pauline Aus, PA-C 01/28/15 1940  Alvira Monday, MD 01/31/15 1028

## 2017-01-09 ENCOUNTER — Encounter (HOSPITAL_COMMUNITY): Payer: Self-pay

## 2017-01-09 ENCOUNTER — Emergency Department (HOSPITAL_COMMUNITY): Payer: Medicaid Other

## 2017-01-09 ENCOUNTER — Emergency Department (HOSPITAL_COMMUNITY)
Admission: EM | Admit: 2017-01-09 | Discharge: 2017-01-09 | Disposition: A | Discharge status: Home / Self Care | Payer: Medicaid Other | Attending: Emergency Medicine | Admitting: Emergency Medicine

## 2017-01-09 DIAGNOSIS — M542 Cervicalgia: Secondary | ICD-10-CM | POA: Diagnosis present

## 2017-01-09 DIAGNOSIS — Z87891 Personal history of nicotine dependence: Secondary | ICD-10-CM | POA: Insufficient documentation

## 2017-01-09 DIAGNOSIS — Z79899 Other long term (current) drug therapy: Secondary | ICD-10-CM | POA: Diagnosis not present

## 2017-01-09 DIAGNOSIS — M436 Torticollis: Secondary | ICD-10-CM | POA: Diagnosis not present

## 2017-01-09 MED ORDER — TRAMADOL HCL 50 MG PO TABS: 100.0000 mg | ORAL_TABLET | Freq: Four times a day (QID) | ORAL | 0 refills | Status: DC | PRN

## 2017-01-09 MED ORDER — CYCLOBENZAPRINE HCL 10 MG PO TABS
10.0000 mg | ORAL_TABLET | Freq: Three times a day (TID) | ORAL | 0 refills | Status: DC
Start: 2017-01-09 — End: 2017-02-27

## 2017-01-09 MED ORDER — ONDANSETRON HCL 4 MG PO TABS
4.0000 mg | ORAL_TABLET | Freq: Once | ORAL | Status: AC
  Administered 2017-01-09: 4 mg via ORAL
  Filled 2017-01-09: qty 1

## 2017-01-09 MED ORDER — DIAZEPAM 5 MG PO TABS
5.0000 mg | ORAL_TABLET | Freq: Once | ORAL | Status: AC
  Administered 2017-01-09: 5 mg via ORAL
  Filled 2017-01-09: qty 1

## 2017-01-09 MED ORDER — HYDROCODONE-ACETAMINOPHEN 5-325 MG PO TABS
2.0000 | ORAL_TABLET | Freq: Once | ORAL | Status: AC
  Administered 2017-01-09: 2 via ORAL
  Filled 2017-01-09: qty 2

## 2017-01-09 NOTE — Discharge Instructions (Signed)
Heating pad to your neck will be helpful. The x-ray of your neck is negative for fracture or dislocation. Your examination shows a muscle strain, consistent with a condition called torticollis. Please use Flexeril 3 times daily. Use Tylenol, and/or ibuprofen for mild pain, use Ultram for more severe pain. Flexeril and Ultram may cause drowsiness. Please do not drive a vehicle, drink alcohol, operate machinery, or participate in activities requiring concentration when taking this medication. Please see Dr. Ledell PeoplesMuse for additional evaluation and follow-up if not improving.

## 2017-01-09 NOTE — ED Provider Notes (Signed)
[facility]-EMERGENCY DEPT Provider Note   CSN: [id number] Arrival date & time: 01/09/17  1037     History   Chief Complaint Chief Complaint  Patient presents with  . Torticollis    HPI [name] [name] is a 31 y.o. female.  Patient is a 31 year old female who presents to the emergency department with a complaint of neck and shoulder pain.  The patient states that this problem started 1-1/2 weeks ago. The patient heard a pop in her neck and she's been having pain in her neck since that time. She states that time she has some numbness involving her fingers. The pain is worse with movement. It is better when she is still, butdoes not resolve the issue. It is of note that the patient has an issue of chronic back pain for which she is being evaluated. The patient denies any high fevers. There is no recent illness to be reported.      Past Medical History:  Diagnosis Date  . Anemia    per patient, iron-deficiency  . Anxiety   . GERD (gastroesophageal reflux disease)   . Stress at home     Patient Active Problem List   Diagnosis Date Noted  . Unspecified constipation 10/29/2012  . Heme positive stool 10/29/2012  . Abdominal pain, other specified site 10/29/2012    Past Surgical History:  Procedure Laterality Date  . CESAREAN SECTION  2008/2012   x2  . COLONOSCOPY WITH ESOPHAGOGASTRODUODENOSCOPY (EGD) N/A 11/04/2012   WUJ:WJXBJY mucosa in the terminal ileum Normal colon/RECTAL BLEEDING DUE TO Moderate sized internal hemorrhoids/MILD Non-erosive gastritis  . ESOPHAGOGASTRODUODENOSCOPY N/A 12/24/2012   NWG:NFAOZH esophagus/ LIMITED VIEW OF GASTRIC MUCOSA DUE TO RETAINED GASTRIC CONTENTS    OB History    No data available       Home Medications    Prior to Admission medications   Medication Sig Start Date End Date Taking? Authorizing Provider  cyclobenzaprine (FLEXERIL) 10 MG tablet Take 1 tablet (10 mg total) by mouth 3 (three) times daily. 01/09/17   Ivery Quale, PA-C    diclofenac (VOLTAREN) 75 MG EC tablet Take 1 tablet (75 mg total) by mouth 2 (two) times daily. Take with food 01/25/15   Triplett, Tammy, PA-C  HYDROcodone-acetaminophen (NORCO/VICODIN) 5-325 MG per tablet Take one tab po q 4-6 hrs prn pain 01/25/15   Triplett, Tammy, PA-C  levonorgestrel (MIRENA) 20 MCG/24HR IUD 1 each by Intrauterine route once.    [provider]  methocarbamol (ROBAXIN) 500 MG tablet Take 1 tablet (500 mg total) by mouth 3 (three) times daily. 01/25/15   Triplett, Tammy, PA-C  ondansetron (ZOFRAN ODT) 4 MG disintegrating tablet Take 1 tablet (4 mg total) by mouth every 8 (eight) hours as needed for nausea or vomiting. 08/30/14   Ward, Layla Maw, DO  pantoprazole (PROTONIX) 40 MG tablet Take 1 tablet (40 mg total) by mouth daily. 08/30/14   Ward, Layla Maw, DO  traMADol (ULTRAM) 50 MG tablet Take 2 tablets (100 mg total) by mouth every 6 (six) hours as needed. 01/09/17   Ivery Quale, PA-C    Family History Family History  Problem Relation Age of Onset  . Colon cancer Neg Hx     Social History Social History  Substance Use Topics  . Smoking status: Former Smoker    Packs/day: 0.50    Years: 3.00    Types: Cigarettes    Quit date: 05/06/2012  . Smokeless tobacco: Never Used     Comment: quit Jan 2014   .  Alcohol use No     Allergies   Bee venom and Penicillins   Review of Systems Review of Systems  Constitutional: Negative for activity change.       All ROS Neg except as noted in HPI  HENT: Negative for nosebleeds.   Eyes: Negative for photophobia and discharge.  Respiratory: Negative for cough, shortness of breath and wheezing.   Cardiovascular: Negative for chest pain and palpitations.  Gastrointestinal: Negative for abdominal pain and blood in stool.  Genitourinary: Negative for dysuria, frequency and hematuria.  Musculoskeletal: Positive for back pain and neck pain. Negative for arthralgias.  Skin: Negative.   Neurological: Negative for  dizziness, seizures and speech difficulty.  Psychiatric/Behavioral: Negative for confusion and hallucinations.     Physical Exam Updated Vital Signs BP (!) 151/93 (BP Location: Right Arm)   Pulse (!) 114   Temp 98.5 F (36.9 C) (Oral)   Resp 18   Ht  (1.549 m)   Wt 49.9 kg (110 lb)   LMP  (LMP Unknown)   SpO2 100%   BMI 20.78 kg/m   Physical Exam  Musculoskeletal:       Left shoulder: She exhibits spasm.       Cervical back: She exhibits tenderness.  There is pain and spasm of the left trapezius area. Is no evidence for any dislocation of the shoulder. There is no palpable step off of the cervical spine.  Neurological:  Grip is symmetrical. There is no atrophy of the thenar eminence. No motor or sensory deficits of the upper extremity appreciated.     ED Treatments / Results  Labs (all labs ordered are listed, but only abnormal results are displayed) Labs Reviewed - No data to display  EKG  EKG Interpretation None       Radiology Dg Cervical Spine Complete  Result Date: 01/09/2017 CLINICAL DATA:  Left-sided neck pain.  Felt a pop. EXAM: CERVICAL SPINE - COMPLETE 4+ VIEW COMPARISON:  None. FINDINGS: There is no evidence of cervical spine fracture or prevertebral soft tissue swelling. Alignment is normal. No other significant bone abnormalities are identified. Disc spaces are preserved. Bilateral foramina are patent. IMPRESSION: Negative cervical spine radiographs. Electronically Signed   By: Elige Ko   On: 01/09/2017 14:38    Procedures Procedures (including critical care time)  Medications Ordered in ED Medications  diazepam (VALIUM) tablet 5 mg (5 mg Oral Given 01/09/17 1354)  HYDROcodone-acetaminophen (NORCO/VICODIN) 5-325 MG per tablet 2 tablet (2 tablets Oral Given 01/09/17 1354)  ondansetron (ZOFRAN) tablet 4 mg (4 mg Oral Given 01/09/17 1354)     Initial Impression / Assessment and Plan / ED Course  I have reviewed the triage vital signs and the  nursing notes.  Pertinent labs & imaging results that were available during my care of the patient were reviewed by me and considered in my medical decision making (see chart for details).       Final Clinical Impressions(s) / ED Diagnoses MDM Vital signs reviewed. The examination favors torticollis. X-ray of the cervical spine is negative for fracture, changes in disc space, or dislocation. No gross neurologic deficit appreciated on examination. Gait is intact.   Neck, shoulder, and arm movement improved after medication. Patient is to use a heating pad. Prescription for Flexeril and Ultram given to the patient. The patient is to follow-up with the primary physician if not improving.    Final diagnoses:  Torticollis    New Prescriptions New Prescriptions   CYCLOBENZAPRINE (FLEXERIL) 10  MG TABLET    Take 1 tablet (10 mg total) by mouth 3 (three) times daily.   TRAMADOL (ULTRAM) 50 MG TABLET    Take 2 tablets (100 mg total) by mouth every 6 (six) hours as needed.     Ivery QualeBryant, Chairty Toman, PA-C 01/09/17 1516    Bethann BerkshireZammit, Joseph, MD 01/09/17 [phone]

## 2017-01-09 NOTE — ED Triage Notes (Addendum)
Patient reports of neck pain that radiates down left arm with numbness into fingers x1.5 week. States she herd a "pop in my neck". Denies any injury. States pain is increased with movement. [facility].

## 2017-02-27 ENCOUNTER — Emergency Department (HOSPITAL_COMMUNITY): Payer: Medicaid Other

## 2017-02-27 ENCOUNTER — Encounter (HOSPITAL_COMMUNITY): Payer: Self-pay

## 2017-02-27 ENCOUNTER — Emergency Department (HOSPITAL_COMMUNITY)
Admission: EM | Admit: 2017-02-27 | Discharge: 2017-02-27 | Disposition: A | Discharge status: Home / Self Care | Payer: Medicaid Other | Attending: Emergency Medicine | Admitting: Emergency Medicine

## 2017-02-27 DIAGNOSIS — R079 Chest pain, unspecified: Secondary | ICD-10-CM | POA: Diagnosis not present

## 2017-02-27 DIAGNOSIS — R091 Pleurisy: Secondary | ICD-10-CM

## 2017-02-27 DIAGNOSIS — T7840XA Allergy, unspecified, initial encounter: Secondary | ICD-10-CM | POA: Diagnosis present

## 2017-02-27 DIAGNOSIS — Z7982 Long term (current) use of aspirin: Secondary | ICD-10-CM | POA: Insufficient documentation

## 2017-02-27 DIAGNOSIS — R51 Headache: Secondary | ICD-10-CM | POA: Insufficient documentation

## 2017-02-27 DIAGNOSIS — R000 Tachycardia, unspecified: Secondary | ICD-10-CM | POA: Insufficient documentation

## 2017-02-27 DIAGNOSIS — Z87891 Personal history of nicotine dependence: Secondary | ICD-10-CM | POA: Insufficient documentation

## 2017-02-27 DIAGNOSIS — D649 Anemia, unspecified: Secondary | ICD-10-CM | POA: Diagnosis not present

## 2017-02-27 DIAGNOSIS — Z79899 Other long term (current) drug therapy: Secondary | ICD-10-CM | POA: Insufficient documentation

## 2017-02-27 LAB — CBC WITH DIFFERENTIAL/PLATELET
Basophils Absolute: 0 10*3/uL (ref 0.0–0.1)
Basophils Relative: 0 %
Eosinophils Absolute: 0.1 10*3/uL (ref 0.0–0.7)
Eosinophils Relative: 1 %
HCT: 41 % (ref 36.0–46.0)
Hemoglobin: 13.5 g/dL (ref 12.0–15.0)
LYMPHS ABS: 1.7 10*3/uL (ref 0.7–4.0)
Lymphocytes Relative: 28 %
MCH: 29.6 pg (ref 26.0–34.0)
MCHC: 32.9 g/dL (ref 30.0–36.0)
MCV: 89.9 fL (ref 78.0–100.0)
MONOS PCT: 7 %
Monocytes Absolute: 0.5 10*3/uL (ref 0.1–1.0)
NEUTROS ABS: 3.9 10*3/uL (ref 1.7–7.7)
Neutrophils Relative %: 64 %
Platelets: 290 10*3/uL (ref 150–400)
RBC: 4.56 MIL/uL (ref 3.87–5.11)
RDW: 12.5 % (ref 11.5–15.5)
WBC: 6.1 10*3/uL (ref 4.0–10.5)

## 2017-02-27 LAB — BASIC METABOLIC PANEL
Anion gap: 8 (ref 5–15)
BUN: 15 mg/dL (ref 6–20)
CHLORIDE: 105 mmol/L (ref 101–111)
CO2: 25 mmol/L (ref 22–32)
CREATININE: 0.84 mg/dL (ref 0.44–1.00)
Calcium: 9 mg/dL (ref 8.9–10.3)
GFR calc Af Amer: >60 mL/min (ref >60)
GFR calc non Af Amer: >60 mL/min (ref >60)
Glucose, Bld: 99 mg/dL (ref 65–99)
Potassium: 4.1 mmol/L (ref 3.5–5.1)
Sodium: 138 mmol/L (ref 135–145)

## 2017-02-27 LAB — PREGNANCY, URINE: PREG TEST UR: NEGATIVE

## 2017-02-27 LAB — D-DIMER, QUANTITATIVE: D-Dimer, Quant: <0.27 ug/mL-FEU (ref 0.00–0.50)

## 2017-02-27 MED ORDER — NAPROXEN 500 MG PO TABS
500.0000 mg | ORAL_TABLET | Freq: Two times a day (BID) | ORAL | 0 refills | Status: DC
Start: 2017-02-27 — End: 2019-04-19

## 2017-02-27 MED ORDER — SODIUM CHLORIDE 0.9 % IV BOLUS (SEPSIS)
1000.0000 mL | Freq: Once | INTRAVENOUS | Status: AC
  Administered 2017-02-27: 1000 mL via INTRAVENOUS

## 2017-02-27 MED ORDER — KETOROLAC TROMETHAMINE 30 MG/ML IJ SOLN
30.0000 mg | Freq: Once | INTRAMUSCULAR | Status: AC
  Administered 2017-02-27: 30 mg via INTRAVENOUS
  Filled 2017-02-27: qty 1

## 2017-02-27 NOTE — Discharge Instructions (Signed)
Naproxen for pain. Robitussin for cough.

## 2017-02-27 NOTE — ED Provider Notes (Signed)
[facility] EMERGENCY DEPARTMENT Provider Note   CSN: [id number] Arrival date & time: 02/27/17  1641     History   Chief Complaint Chief Complaint  Patient presents with  . Allergic Reaction    HPI [name] [name] is a 31 y.o. female. CC:  Chest pain    HPI 31 year old female. Has had a cough and cold. [place] Mucinex DM. A few hours later felt a sudden sharp pain in her left chest. States she became numb in her face and hands. Thought she was going to pass out. These all resolved except for "head feels numb".  Felt like she was going to pass out.  These have all resolved except her "head feels numb".   Past Medical History:  Diagnosis Date  . Anemia    per patient, iron-deficiency  . Anxiety   . GERD (gastroesophageal reflux disease)   . Stress at home     Patient Active Problem List   Diagnosis Date Noted  . Unspecified constipation 10/29/2012  . Heme positive stool 10/29/2012  . Abdominal pain, other specified site 10/29/2012    Past Surgical History:  Procedure Laterality Date  . CESAREAN SECTION  2008/2012   x2  . COLONOSCOPY WITH ESOPHAGOGASTRODUODENOSCOPY (EGD) N/A 11/04/2012   WUJ:WJXBJYRMR:Normal mucosa in the terminal ileum Normal colon/RECTAL BLEEDING DUE TO Moderate sized internal hemorrhoids/MILD Non-erosive gastritis  . ESOPHAGOGASTRODUODENOSCOPY N/A 12/24/2012   NWG:NFAOZHSLF:NORMAL esophagus/ LIMITED VIEW OF GASTRIC MUCOSA DUE TO RETAINED GASTRIC CONTENTS    OB History    No data available       Home Medications    Prior to Admission medications   Medication Sig Start Date End Date Taking? Authorizing Provider  aspirin EC 81 MG tablet Take 81 mg by mouth once.   Yes [provider]  dextromethorphan-guaiFENesin (MUCINEX DM) 30-600 MG 12hr tablet Take 1 tablet by mouth once as needed for cough.   Yes [provider]  levonorgestrel (MIRENA) 20 MCG/24HR IUD 1 each by Intrauterine route once.   Yes [provider]  naproxen (NAPROSYN) 500  MG tablet Take 1 tablet (500 mg total) by mouth 2 (two) times daily. 02/27/17   Rolland PorterJames, Zoie Sarin, MD    Family History Family History  Problem Relation Age of Onset  . Colon cancer Neg Hx     Social History Social History  Substance Use Topics  . Smoking status: Former Smoker    Packs/day: 0.50    Years: 3.00    Types: Cigarettes    Quit date: 05/06/2012  . Smokeless tobacco: Never Used     Comment: quit Jan 2014   . Alcohol use No     Allergies   Bee venom and Penicillins   Review of Systems Review of Systems  Constitutional: Negative for appetite change, chills, diaphoresis, fatigue and fever.  HENT: Negative for mouth sores, sore throat and trouble swallowing.   Eyes: Negative for visual disturbance.  Respiratory: Negative for cough, chest tightness, shortness of breath and wheezing.   Cardiovascular: Positive for chest pain.  Gastrointestinal: Negative for abdominal distention, abdominal pain, diarrhea, nausea and vomiting.  Endocrine: Negative for polydipsia, polyphagia and polyuria.  Genitourinary: Negative for dysuria, frequency and hematuria.  Musculoskeletal: Negative for gait problem.  Skin: Negative for color change, pallor and rash.  Neurological: Positive for numbness. Negative for dizziness, syncope, light-headedness and headaches.  Hematological: Does not bruise/bleed easily.  Psychiatric/Behavioral: Negative for behavioral problems and confusion. The patient is nervous/anxious.      Physical Exam  Updated Vital Signs BP 131/81   Pulse 97   Temp 98.7 F (37.1 C) (Oral)   Resp 20   Ht 5\' 1"  (1.549 m)   Wt 49.9 kg (110 lb)   SpO2 100%   BMI 20.78 kg/m   Physical Exam  Constitutional: She is oriented to person, place, and time. She appears well-developed and well-nourished. No distress.  HENT:  Head: Normocephalic.  Eyes: Pupils are equal, round, and reactive to light. Conjunctivae are normal. No scleral icterus.  Neck: Normal range of motion. Neck  supple. No thyromegaly present.  Cardiovascular: Normal rate and regular rhythm.  Exam reveals no gallop and no friction rub.   No murmur heard. Heart rate 103. Her husband who accompanies her states they were cat at the gym "all the time". He states her resting heart rate is "always over 100"..  Pulmonary/Chest: Effort normal and breath sounds normal. No respiratory distress. She has no wheezes. She has no rales.  Abdominal: Soft. Bowel sounds are normal. She exhibits no distension. There is no tenderness. There is no rebound.  Musculoskeletal: Normal range of motion.  Neurological: She is alert and oriented to person, place, and time.  Skin: Skin is warm and dry. No rash noted.  Psychiatric: She has a normal mood and affect. Her behavior is normal.     ED Treatments / Results  Labs (all labs ordered are listed, but only abnormal results are displayed) Labs Reviewed  CBC WITH DIFFERENTIAL/PLATELET  BASIC METABOLIC PANEL  D-DIMER, QUANTITATIVE (NOT AT [facility])  PREGNANCY, URINE    EKG  EKG Interpretation  Date/Time:  Thursday February 27 2017 16:55:24 EDT Ventricular Rate:  117 PR Interval:    QRS Duration: 89 QT Interval:  324 QTC Calculation: 452 R Axis:   20 Text Interpretation:  Sinus tachycardia Probable left atrial enlargement Low voltage, precordial leads Confirmed by Rolland Porter ([id number]) on 02/27/2017 5:33:50 PM       Radiology Dg Chest Port 1 View  Result Date: 02/27/2017 CLINICAL DATA:  Patient with sharp left-sided chest pain. EXAM: PORTABLE CHEST 1 VIEW COMPARISON:  Chest radiograph 08/30/2014. FINDINGS: Monitoring leads overlie the patient. Stable cardiac and mediastinal contours. No consolidative pulmonary opacities. No pleural effusion or pneumothorax. IMPRESSION: No acute cardiopulmonary process. Electronically Signed   By: Annia Belt M.D.   On: 02/27/2017 17:52    Procedures Procedures (including critical care time)  Medications Ordered in ED Medications    sodium chloride 0.9 % bolus 1,000 mL (0 mLs Intravenous Stopped 02/27/17 1943)  ketorolac (TORADOL) 30 MG/ML injection 30 mg (30 mg Intravenous Given 02/27/17 1940)     Initial Impression / Assessment and Plan / ED Course  I have reviewed the triage vital signs and the nursing notes.  Pertinent labs & imaging results that were available during my care of the patient were reviewed by me and considered in my medical decision making (see chart for details).    Doubt PE however she is tachycardic. Has Mirena IUD. Does not smoke. EKG shows no acute or ischemic changes but tachycardia. Chest x-ray normal. D-dimer normal. Troponin normal. Given fluids and Toradol. Symptoms improved. Heart rate 90. Able to without hypoxemia, tachycardia, dyspnea, or symptoms. Likely pleurisy and anxiety. Plan discharge home. Anti-inflammatories. Recheck any worsening symptoms.  Final Clinical Impressions(s) / ED Diagnoses   Final diagnoses:  Pleurisy    New Prescriptions Discharge Medication List as of 02/27/2017  7:31 PM    START taking these medications   Details  naproxen (NAPROSYN) 500 MG tablet Take 1 tablet (500 mg total) by mouth 2 (two) times daily., Starting Thu 02/27/2017, Print         Rolland Porter, MD 02/27/17 [id number]

## 2017-02-27 NOTE — ED Triage Notes (Signed)
Patient reports of taking Mucinex DM approx. 1 hour ago for congestion. States she started to fel numb all over, slurred speech and chest pain. Denies pain at this time. Patient states she feels as she is in a fog.

## 2017-08-11 ENCOUNTER — Emergency Department (HOSPITAL_COMMUNITY)
Admission: EM | Admit: 2017-08-11 | Discharge: 2017-08-11 | Disposition: A | Discharge status: Home / Self Care | Payer: Medicaid Other | Attending: Emergency Medicine | Admitting: Emergency Medicine

## 2017-08-11 ENCOUNTER — Other Ambulatory Visit: Payer: Self-pay

## 2017-08-11 ENCOUNTER — Emergency Department (HOSPITAL_COMMUNITY): Payer: Medicaid Other

## 2017-08-11 ENCOUNTER — Encounter (HOSPITAL_COMMUNITY): Payer: Self-pay | Admitting: *Deleted

## 2017-08-11 DIAGNOSIS — R05 Cough: Secondary | ICD-10-CM | POA: Diagnosis present

## 2017-08-11 DIAGNOSIS — J069 Acute upper respiratory infection, unspecified: Secondary | ICD-10-CM | POA: Diagnosis not present

## 2017-08-11 DIAGNOSIS — Z87891 Personal history of nicotine dependence: Secondary | ICD-10-CM | POA: Diagnosis not present

## 2017-08-11 DIAGNOSIS — R070 Pain in throat: Secondary | ICD-10-CM | POA: Insufficient documentation

## 2017-08-11 DIAGNOSIS — Z7982 Long term (current) use of aspirin: Secondary | ICD-10-CM | POA: Diagnosis not present

## 2017-08-11 HISTORY — DX: Personal history of diseases of the blood and blood-forming organs and certain disorders involving the immune mechanism: Z86.2

## 2017-08-11 LAB — RAPID STREP SCREEN (MED CTR MEBANE ONLY): STREPTOCOCCUS, GROUP A SCREEN (DIRECT): NEGATIVE

## 2017-08-11 MED ORDER — BENZONATATE 100 MG PO CAPS: 100.0000 mg | ORAL_CAPSULE | Freq: Three times a day (TID) | ORAL | 0 refills | Status: DC | PRN

## 2017-08-11 MED ORDER — ALBUTEROL SULFATE HFA 108 (90 BASE) MCG/ACT IN AERS: 2.0000 | INHALATION_SPRAY | RESPIRATORY_TRACT | 0 refills | Status: AC | PRN

## 2017-08-11 NOTE — ED Triage Notes (Addendum)
Pt c/o dry cough and sore throat x 9 days. Denies fever, nasal congestion/drainage, n/v. Pt has used Vick's Vapor Rub, allergy medicine and Ibuprofen.

## 2017-08-11 NOTE — ED Provider Notes (Signed)
[facility] EMERGENCY DEPARTMENT Provider Note   CSN: [id number] Arrival date & time: 08/11/17  4098     History   Chief Complaint Chief Complaint  Patient presents with  . Cough    HPI KATALEA UCCI is a 32 y.o. female.  HPI  Pt was seen at 0730.  Per pt, c/o gradual onset and persistence of constant sore throat and cough for the past 9 days.  Denies fevers, no rash, no CP/SOB, no N/V/D, no abd pain.    Past Medical History:  Diagnosis Date  . Anxiety   . GERD (gastroesophageal reflux disease)   . Hx of iron deficiency anemia   . Stress at home     Patient Active Problem List   Diagnosis Date Noted  . Unspecified constipation 10/29/2012  . Heme positive stool 10/29/2012  . Abdominal pain, other specified site 10/29/2012    Past Surgical History:  Procedure Laterality Date  . CESAREAN SECTION  2008/2012   x2  . COLONOSCOPY WITH ESOPHAGOGASTRODUODENOSCOPY (EGD) N/A 11/04/2012   JXB:JYNWGN mucosa in the terminal ileum Normal colon/RECTAL BLEEDING DUE TO Moderate sized internal hemorrhoids/MILD Non-erosive gastritis  . ESOPHAGOGASTRODUODENOSCOPY N/A 12/24/2012   FAO:ZHYQMV esophagus/ LIMITED VIEW OF GASTRIC MUCOSA DUE TO RETAINED GASTRIC CONTENTS     OB History   None      Home Medications    Prior to Admission medications   Medication Sig Start Date End Date Taking? Authorizing Provider  aspirin EC 81 MG tablet Take 81 mg by mouth once.    [provider]  dextromethorphan-guaiFENesin (MUCINEX DM) 30-600 MG 12hr tablet Take 1 tablet by mouth once as needed for cough.    [provider]  levonorgestrel (MIRENA) 20 MCG/24HR IUD 1 each by Intrauterine route once.    [provider]  naproxen (NAPROSYN) 500 MG tablet Take 1 tablet (500 mg total) by mouth 2 (two) times daily. 02/27/17   Rolland Porter, MD    Family History Family History  Problem Relation Age of Onset  . Colon cancer Neg Hx     Social History Social History    Tobacco Use  . Smoking status: Former Smoker    Packs/day: 0.50    Years: 3.00    Pack years: 1.50    Types: Cigarettes    Last attempt to quit: 05/06/2012    Years since quitting: 5.2  . Smokeless tobacco: Never Used  . Tobacco comment: quit Jan 2014   Substance Use Topics  . Alcohol use: No  . Drug use: No     Allergies   Bee venom and Penicillins   Review of Systems Review of Systems ROS: Statement: All systems negative except as marked or noted in the HPI; Constitutional: Negative for fever and chills. ; ; Eyes: Negative for eye pain, redness and discharge. ; ; ENMT: Negative for ear pain, hoarseness, nasal congestion, sinus pressure and +sore throat. ; ; Cardiovascular: Negative for chest pain, palpitations, diaphoresis, dyspnea and peripheral edema. ; ; Respiratory: +cough. Negative for wheezing and stridor. ; ; Gastrointestinal: Negative for nausea, vomiting, diarrhea, abdominal pain, blood in stool, hematemesis, jaundice and rectal bleeding. . ; ; Genitourinary: Negative for dysuria, flank pain and hematuria. ; ; Musculoskeletal: Negative for back pain and neck pain. Negative for swelling and trauma.; ; Skin: Negative for pruritus, rash, abrasions, blisters, bruising and skin lesion.; ; Neuro: Negative for headache, lightheadedness and neck stiffness. Negative for weakness, altered level of consciousness, altered mental status, extremity weakness, paresthesias, involuntary  movement, seizure and syncope.       Physical Exam Updated Vital Signs BP (!) 137/99   Pulse (!) 107   Temp 98.2 F (36.8 C) (Oral)   Resp 18   Ht 5\' 1"  (1.549 m)   Wt 52.2 kg (115 lb)   SpO2 100%   BMI 21.73 kg/m   Physical Exam 0735: Physical examination:  Nursing notes reviewed; Vital signs and O2 SAT reviewed;  Constitutional: Well developed, Well nourished, Well hydrated, In no acute distress; Head:  Normocephalic, atraumatic; Eyes: EOMI, PERRL, No scleral icterus; ENMT: TM's clear bilat.  +edemetous nasal turbinates bilat with clear rhinorrhea. Mouth and pharynx normal, Mucous membranes moist; Neck: Supple, Full range of motion, No lymphadenopathy; Cardiovascular: Regular rate and rhythm, No gallop; Respiratory: Breath sounds clear & equal bilaterally, No wheezes.  Speaking full sentences with ease, Normal respiratory effort/excursion; Chest: Nontender, Movement normal; Abdomen: Soft, Nontender, Nondistended, Normal bowel sounds; Genitourinary: No CVA tenderness; Extremities: Peripheral pulses normal, No tenderness, No edema, No calf edema or asymmetry.; Neuro: AA&Ox3, Major CN grossly intact.  Speech clear. No gross focal motor or sensory deficits in extremities. Climbs on and off stretcher easily by herself. Gait steady..; Skin: Color normal, Warm, Dry.   ED Treatments / Results  Labs (all labs ordered are listed, but only abnormal results are displayed)   EKG None  Radiology   Procedures Procedures (including critical care time)  Medications Ordered in ED Medications - No data to display   Initial Impression / Assessment and Plan / ED Course  I have reviewed the triage vital signs and the nursing notes.  Pertinent labs & imaging results that were available during my care of the patient were reviewed by me and considered in my medical decision making (see chart for details).  MDM Reviewed: previous chart, nursing note and vitals Interpretation: x-ray and labs   Results for orders placed or performed during the hospital encounter of 08/11/17  Rapid strep screen  Result Value Ref Range   Streptococcus, Group A Screen (Direct) NEGATIVE NEGATIVE   Dg Chest 2 View Result Date: 08/11/2017 CLINICAL DATA:  Cough and sore throat EXAM: CHEST - 2 VIEW COMPARISON:  February 27, 2017 FINDINGS: Lungs are clear. The heart size and pulmonary vascularity are normal. No adenopathy. No bone lesions. IMPRESSION: No edema or consolidation. Electronically Signed   By: Bretta BangWilliam  Woodruff  III M.D.   On: 08/11/2017 07:52    0805:  Workup reassuring. Tx symptomatically at this time. Dx and testing d/w pt.  Questions answered.  Verb understanding, agreeable to d/c home with outpt f/u.    Final Clinical Impressions(s) / ED Diagnoses   Final diagnoses:  None    ED Discharge Orders    None       Samuel JesterMcManus, Jessamy Torosyan, DO 08/15/17 [id number]

## 2017-08-11 NOTE — Discharge Instructions (Signed)
Take over the counter decongestant (such as sudafed), as directed on packaging, for the next week.  Use over the counter normal saline nasal spray, with frequent nose blowing, several times per day for the next 2 weeks. Take over the counter tylenol and ibuprofen, as directed on packaging, as needed for discomfort.  Gargle with warm water several times per day to help with discomfort.  May also use over the counter sore throat pain medicines such as chloraseptic or sucrets, as directed on packaging, as needed for discomfort. Use your albuterol inhaler (2 to 4 puffs) every 4 hours for the next 7 days, then as needed for cough, wheezing, or shortness of breath.  Call your regular medical doctor today to schedule a follow up appointment within the next 3 days.  Return to the Emergency Department immediately sooner if worsening.

## 2017-08-13 LAB — CULTURE, GROUP A STREP (THRC)

## 2019-02-04 ENCOUNTER — Other Ambulatory Visit: Payer: Self-pay

## 2019-02-04 ENCOUNTER — Encounter (HOSPITAL_COMMUNITY): Payer: Self-pay | Admitting: Emergency Medicine

## 2019-02-04 ENCOUNTER — Emergency Department (HOSPITAL_COMMUNITY)
Admission: EM | Admit: 2019-02-04 | Discharge: 2019-02-04 | Disposition: A | Discharge status: Home / Self Care | Payer: Medicaid Other | Attending: Emergency Medicine | Admitting: Emergency Medicine

## 2019-02-04 DIAGNOSIS — Y929 Unspecified place or not applicable: Secondary | ICD-10-CM | POA: Diagnosis not present

## 2019-02-04 DIAGNOSIS — Y939 Activity, unspecified: Secondary | ICD-10-CM | POA: Insufficient documentation

## 2019-02-04 DIAGNOSIS — Z87891 Personal history of nicotine dependence: Secondary | ICD-10-CM | POA: Diagnosis not present

## 2019-02-04 DIAGNOSIS — Y999 Unspecified external cause status: Secondary | ICD-10-CM | POA: Diagnosis not present

## 2019-02-04 DIAGNOSIS — S199XXA Unspecified injury of neck, initial encounter: Secondary | ICD-10-CM | POA: Diagnosis present

## 2019-02-04 DIAGNOSIS — S161XXA Strain of muscle, fascia and tendon at neck level, initial encounter: Secondary | ICD-10-CM | POA: Insufficient documentation

## 2019-02-04 MED ORDER — NAPROXEN 500 MG PO TABS: 500.0000 mg | ORAL_TABLET | Freq: Two times a day (BID) | ORAL | 0 refills | Status: DC

## 2019-02-04 MED ORDER — METHOCARBAMOL 500 MG PO TABS
1000.0000 mg | ORAL_TABLET | Freq: Once | ORAL | Status: AC
  Administered 2019-02-04: 1000 mg via ORAL
  Filled 2019-02-04: qty 2

## 2019-02-04 MED ORDER — METHOCARBAMOL 500 MG PO TABS: 500.0000 mg | ORAL_TABLET | Freq: Three times a day (TID) | ORAL | 0 refills | Status: DC | PRN

## 2019-02-04 NOTE — Discharge Instructions (Signed)
You were evaluated in the Emergency Department and after careful evaluation, we did not find any emergent condition requiring admission or further testing in the hospital.  Your exam/testing today was overall reassuring.  Your pain seems to be related to muscle strain or spasm.  Please use the medications provided as directed.  We also recommend warm compresses.  Please return to the Emergency Department if you experience any worsening of your condition.  We encourage you to follow up with a primary care provider.  Thank you for allowing [place] to be a part of your care.
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
## 2019-04-06 ENCOUNTER — Ambulatory Visit (HOSPITAL_COMMUNITY)
Admission: RE | Admit: 2019-04-06 | Discharge: 2019-04-06 | Disposition: A | Discharge status: Home / Self Care | Payer: Medicaid Other | Source: Ambulatory Visit | Attending: Nurse Practitioner | Admitting: Nurse Practitioner

## 2019-04-06 ENCOUNTER — Other Ambulatory Visit (HOSPITAL_COMMUNITY): Payer: Self-pay | Admitting: Nurse Practitioner

## 2019-04-06 ENCOUNTER — Other Ambulatory Visit: Payer: Self-pay

## 2019-04-06 DIAGNOSIS — M5440 Lumbago with sciatica, unspecified side: Secondary | ICD-10-CM | POA: Insufficient documentation

## 2019-04-06 DIAGNOSIS — M549 Dorsalgia, unspecified: Secondary | ICD-10-CM

## 2019-04-19 ENCOUNTER — Encounter: Payer: Self-pay | Admitting: Cardiology

## 2019-04-19 ENCOUNTER — Other Ambulatory Visit: Payer: Self-pay

## 2019-04-19 ENCOUNTER — Ambulatory Visit (INDEPENDENT_AMBULATORY_CARE_PROVIDER_SITE_OTHER): Payer: Medicaid Other | Admitting: Cardiology

## 2019-04-19 ENCOUNTER — Encounter: Payer: Self-pay | Admitting: *Deleted

## 2019-04-19 ENCOUNTER — Telehealth: Payer: Self-pay | Admitting: Cardiology

## 2019-04-19 VITALS — BP 142/85 | HR 101 | Ht 61.0 in | Wt 140.2 lb

## 2019-04-19 DIAGNOSIS — R0602 Shortness of breath: Secondary | ICD-10-CM

## 2019-04-19 DIAGNOSIS — R079 Chest pain, unspecified: Secondary | ICD-10-CM

## 2019-04-19 NOTE — Patient Instructions (Signed)
Your physician recommends that you schedule a follow-up appointment in: PENDING WITH DR BRANCH  Your physician recommends that you continue on your current medications as directed. Please refer to the Current Medication list given to you today.  Your physician has requested that you have an echocardiogram. Echocardiography is a painless test that uses sound waves to create images of your heart. It provides your doctor with information about the size and shape of your heart and how well your heart's chambers and valves are working. This procedure takes approximately one hour. There are no restrictions for this procedure.  Thank you for choosing [facility]!!    

## 2019-04-19 NOTE — Telephone Encounter (Signed)
  Precert needed for: Echo   Location: [facility]    Date: May 05, 2019 

## 2019-04-19 NOTE — Progress Notes (Signed)
Clinical Summary [name] [name] is a 33 y.o.female seen as new patient for the following medical problems.   1. Chest pain - started last year - pressure/squeezing/sharp pain . Midchest chest, radiates left shoulder. 12/10 in severity. Typically occurs with activity. +SOB, feels hot. Not positional. Lasts 20-30 minutes, can last up 6 hours. Can radiate into back of neck at times. Pain is worst with cold weather. Breathing can improve with albuterol. Iburpofen can sometimes symptoms.    - admitted to hospital [facility] in August. 6 hour episode pain.  - SOB with activities.    CAD risk factors: no known.  Adoptned, unkonwn family history.    Past Medical History:  Diagnosis Date  . Anxiety   . GERD (gastroesophageal reflux disease)   . Hx of iron deficiency anemia   . Stress at home      Allergies  Allergen Reactions  . Bee Venom Swelling  . Penicillins Other (See Comments)    REACTION: Unknown     Current Outpatient Medications  Medication Sig Dispense Refill  . albuterol (PROVENTIL HFA;VENTOLIN HFA) 108 (90 Base) MCG/ACT inhaler Inhale 2 puffs into the lungs every 4 (four) hours as needed for wheezing or shortness of breath. 1 Inhaler 0  . aspirin EC 81 MG tablet Take 81 mg by mouth once.    . benzonatate (TESSALON) 100 MG capsule Take 1 capsule (100 mg total) by mouth 3 (three) times daily as needed for cough. 15 capsule 0  . dextromethorphan-guaiFENesin (MUCINEX DM) 30-600 MG 12hr tablet Take 1 tablet by mouth once as needed for cough.    Marland Kitchen levonorgestrel (MIRENA) 20 MCG/24HR IUD 1 each by Intrauterine route once.    . methocarbamol (ROBAXIN) 500 MG tablet Take 1 tablet (500 mg total) by mouth every 8 (eight) hours as needed for muscle spasms. 30 tablet 0  . naproxen (NAPROSYN) 500 MG tablet Take 1 tablet (500 mg total) by mouth 2 (two) times daily. 30 tablet 0  . naproxen (NAPROSYN) 500 MG tablet Take 1 tablet (500 mg total) by mouth 2 (two) times daily. 30 tablet 0    No current facility-administered medications for this visit.     Past Surgical History:  Procedure Laterality Date  . CESAREAN SECTION  2008/2012   x2  . COLONOSCOPY WITH ESOPHAGOGASTRODUODENOSCOPY (EGD) N/A 11/04/2012   BMW:UXLKGM mucosa in the terminal ileum Normal colon/RECTAL BLEEDING DUE TO Moderate sized internal hemorrhoids/MILD Non-erosive gastritis  . ESOPHAGOGASTRODUODENOSCOPY N/A 12/24/2012   WNU:UVOZDG esophagus/ LIMITED VIEW OF GASTRIC MUCOSA DUE TO RETAINED GASTRIC CONTENTS     Allergies  Allergen Reactions  . Bee Venom Swelling  . Penicillins Other (See Comments)    REACTION: Unknown      Family History  Problem Relation Age of Onset  . Colon cancer Neg Hx      Social History Ms. Navejas reports that she quit smoking about 6 years ago. Her smoking use included cigarettes. She has a 1.50 pack-year smoking history. She has never used smokeless tobacco. Ms. Cimino reports no history of alcohol use.   Review of Systems CONSTITUTIONAL: No weight loss, fever, chills, weakness or fatigue.  HEENT: Eyes: No visual loss, blurred vision, double vision or yellow sclerae.No hearing loss, sneezing, congestion, runny nose or sore throat.  SKIN: No rash or itching.  CARDIOVASCULAR: per hpi RESPIRATORY: No shortness of breath, cough or sputum.  GASTROINTESTINAL: No anorexia, nausea, vomiting or diarrhea. No abdominal pain or blood.  GENITOURINARY: No burning on urination,  no polyuria NEUROLOGICAL: No headache, dizziness, syncope, paralysis, ataxia, numbness or tingling in the extremities. No change in bowel or bladder control.  MUSCULOSKELETAL: No muscle, back pain, joint pain or stiffness.  LYMPHATICS: No enlarged nodes. No history of splenectomy.  PSYCHIATRIC: No history of depression or anxiety.  ENDOCRINOLOGIC: No reports of sweating, cold or heat intolerance. No polyuria or polydipsia.  Marland Kitchen   Physical Examination Today's Vitals   04/19/19 1307  BP: (!) 142/85   Pulse: (!) 101  SpO2: 99%  Weight: 140 lb 3.2 oz (63.6 kg)  Height: 5\' 1"  (1.549 m)   Body mass index is 26.49 kg/m.  Gen: resting comfortably, no acute distress HEENT: no scleral icterus, pupils equal round and reactive, no palptable cervical adenopathy,  CV: RRR, no m/r/g, no jvd Resp: Clear to auscultation bilaterally GI: abdomen is soft, non-tender, non-distended, normal bowel sounds, no hepatosplenomegaly MSK: extremities are warm, no edema.  Skin: warm, no rash Neuro:  no focal deficits Psych: appropriate affect    Assessment and Plan  1. Chest pain/SOB - young patient without any CAD risk factors, though she is adopted and we do not know her family history - pain is not typical for ischemia - given chest pain and SOB will obtain an echo to evaluate any underlying structural heart disease that may be contributing - no plans for ischemic testing at this time -EKG today shows SR no acute ischemic changes - request records from [facility] in [place] where she had an ER visit   F/u pending echo results      Arnoldo Lenis, M.D.

## 2019-05-05 ENCOUNTER — Other Ambulatory Visit: Payer: Self-pay

## 2019-05-05 ENCOUNTER — Ambulatory Visit (INDEPENDENT_AMBULATORY_CARE_PROVIDER_SITE_OTHER): Payer: Medicaid Other

## 2019-05-05 DIAGNOSIS — R079 Chest pain, unspecified: Secondary | ICD-10-CM

## 2019-05-10 ENCOUNTER — Telehealth: Payer: Self-pay | Admitting: *Deleted

## 2019-05-10 NOTE — Telephone Encounter (Signed)
-----   Message from Antoine Poche, MD sent at 05/06/2019  9:20 AM EST ----- Normal echo, her heart function is normal. She needs to f/u with pcp to consider other possibel causes of her symptoms. May f/u with [place] as needed   Dominga Ferry MD

## 2019-05-10 NOTE — Telephone Encounter (Signed)
Pt voiced understanding - routed to pcp  

## 2020-02-09 ENCOUNTER — Other Ambulatory Visit (HOSPITAL_COMMUNITY)
Admission: RE | Admit: 2020-02-09 | Discharge: 2020-02-09 | Disposition: A | Discharge status: Home / Self Care | Payer: Medicaid Other | Source: Ambulatory Visit | Attending: Family Medicine | Admitting: Family Medicine

## 2020-02-09 DIAGNOSIS — R519 Headache, unspecified: Secondary | ICD-10-CM | POA: Insufficient documentation

## 2020-02-09 DIAGNOSIS — F419 Anxiety disorder, unspecified: Secondary | ICD-10-CM | POA: Insufficient documentation

## 2020-02-09 DIAGNOSIS — D696 Thrombocytopenia, unspecified: Secondary | ICD-10-CM | POA: Insufficient documentation

## 2020-02-09 LAB — COMPREHENSIVE METABOLIC PANEL
ALT: 20 U/L (ref 0–44)
AST: 14 U/L — ABNORMAL LOW (ref 15–41)
Albumin: 4.1 g/dL (ref 3.5–5.0)
Alkaline Phosphatase: 55 U/L (ref 38–126)
Anion gap: 8 (ref 5–15)
BUN: 10 mg/dL (ref 6–20)
CO2: 24 mmol/L (ref 22–32)
Calcium: 9 mg/dL (ref 8.9–10.3)
Chloride: 104 mmol/L (ref 98–111)
Creatinine, Ser: 0.83 mg/dL (ref 0.44–1.00)
GFR calc non Af Amer: >60 mL/min (ref >60)
Glucose, Bld: 103 mg/dL — ABNORMAL HIGH (ref 70–99)
Potassium: 4.2 mmol/L (ref 3.5–5.1)
Sodium: 136 mmol/L (ref 135–145)
Total Bilirubin: 0.5 mg/dL (ref 0.3–1.2)
Total Protein: 7.6 g/dL (ref 6.5–8.1)

## 2020-02-09 LAB — CBC
HCT: 38 % (ref 36.0–46.0)
Hemoglobin: 12.3 g/dL (ref 12.0–15.0)
MCH: 28.9 pg (ref 26.0–34.0)
MCHC: 32.4 g/dL (ref 30.0–36.0)
MCV: 89.4 fL (ref 80.0–100.0)
Platelets: 267 10*3/uL (ref 150–400)
RBC: 4.25 MIL/uL (ref 3.87–5.11)
RDW: 12.8 % (ref 11.5–15.5)
WBC: 5.4 10*3/uL (ref 4.0–10.5)
nRBC: 0 % (ref 0.0–0.2)

## 2020-02-09 LAB — D-DIMER, QUANTITATIVE: D-Dimer, Quant: 0.42 ug/mL-FEU (ref 0.00–0.50)

## 2020-02-09 LAB — FIBRINOGEN: Fibrinogen: 341 mg/dL (ref 210–475)

## 2020-02-11 ENCOUNTER — Other Ambulatory Visit (HOSPITAL_COMMUNITY)
Admission: RE | Admit: 2020-02-11 | Discharge: 2020-02-11 | Disposition: A | Discharge status: Home / Self Care | Payer: Medicaid Other | Source: Ambulatory Visit | Attending: Family Medicine | Admitting: Family Medicine

## 2020-02-11 ENCOUNTER — Other Ambulatory Visit: Payer: Self-pay

## 2020-02-11 DIAGNOSIS — R110 Nausea: Secondary | ICD-10-CM | POA: Insufficient documentation

## 2020-02-11 DIAGNOSIS — R519 Headache, unspecified: Secondary | ICD-10-CM | POA: Insufficient documentation

## 2020-02-11 LAB — SEDIMENTATION RATE: Sed Rate: 4 mm/hr (ref 0–22)

## 2020-03-08 ENCOUNTER — Ambulatory Visit (HOSPITAL_COMMUNITY)
Admission: RE | Admit: 2020-03-08 | Discharge: 2020-03-08 | Disposition: A | Discharge status: Home / Self Care | Payer: Medicaid Other | Source: Ambulatory Visit | Attending: Family Medicine | Admitting: Family Medicine

## 2020-03-08 ENCOUNTER — Other Ambulatory Visit (HOSPITAL_COMMUNITY): Payer: Self-pay | Admitting: Family Medicine

## 2020-03-08 ENCOUNTER — Other Ambulatory Visit: Payer: Self-pay

## 2020-03-08 DIAGNOSIS — M5489 Other dorsalgia: Secondary | ICD-10-CM | POA: Insufficient documentation

## 2020-03-08 DIAGNOSIS — M546 Pain in thoracic spine: Secondary | ICD-10-CM | POA: Diagnosis present

## 2020-03-08 DIAGNOSIS — M542 Cervicalgia: Secondary | ICD-10-CM | POA: Diagnosis present

## 2020-04-04 ENCOUNTER — Other Ambulatory Visit (HOSPITAL_COMMUNITY): Payer: Self-pay | Admitting: Family Medicine

## 2020-04-04 ENCOUNTER — Other Ambulatory Visit: Payer: Self-pay | Admitting: Family Medicine

## 2020-04-04 DIAGNOSIS — M542 Cervicalgia: Secondary | ICD-10-CM

## 2020-04-13 ENCOUNTER — Ambulatory Visit (HOSPITAL_COMMUNITY)
Admission: RE | Admit: 2020-04-13 | Discharge: 2020-04-13 | Disposition: A | Discharge status: Home / Self Care | Payer: Medicaid Other | Source: Ambulatory Visit | Attending: Family Medicine | Admitting: Family Medicine

## 2020-04-13 ENCOUNTER — Other Ambulatory Visit: Payer: Self-pay

## 2020-04-13 DIAGNOSIS — M542 Cervicalgia: Secondary | ICD-10-CM | POA: Insufficient documentation

## 2020-05-29 ENCOUNTER — Ambulatory Visit: Payer: Medicaid Other | Admitting: Diagnostic Neuroimaging

## 2020-05-29 ENCOUNTER — Encounter: Payer: Self-pay | Admitting: Diagnostic Neuroimaging

## 2020-05-29 ENCOUNTER — Other Ambulatory Visit: Payer: Self-pay

## 2020-05-29 VITALS — BP 149/103 | HR 121 | Ht 61.0 in | Wt 135.0 lb

## 2020-05-29 DIAGNOSIS — R519 Headache, unspecified: Secondary | ICD-10-CM

## 2020-05-29 NOTE — Patient Instructions (Signed)
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

## 2020-05-29 NOTE — Progress Notes (Signed)
[facility]  PATIENT: [name] [name] DOB: April 10, 1986  REFERRING CLINICIAN: John Giovanni, MD HISTORY FROM: patient  REASON FOR VISIT: new consult    HISTORICAL  CHIEF COMPLAINT:  Chief Complaint  Patient presents with  . Headache    Rm 6 New Pt,  fiancee- Chrissie Noa "got Covid vaccine Sept 2021 and that's when problems started"    HISTORY OF PRESENT ILLNESS:   35 year old female here for elevation of headache and neck pain.  September 2021 patient had second dose of Covid vaccine and the following day she felt pain and numbness in her head, neck, arms and legs.  Now patient continues to have daily headache in her occipital region on the right side, radiating to her whole head, sometimes associated with nausea.  No vision changes no sensitive to light or sound.  Also having neck pain, arm and leg pain and numbness.  No history of migraines.  Patient had MRI of the cervical spine which showed mild to moderate spinal stenosis but no cord signal abnormality.  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  She went to neurosurgery clinic for evaluation who recommended neurology follow-up.   REVIEW OF SYSTEMS: Full 14 system review of systems performed and negative with exception of: As per HPI.  ALLERGIES: Allergies  Allergen Reactions  . Bee Venom Swelling  . Penicillins Other (See Comments)    REACTION: Unknown    HOME MEDICATIONS: Outpatient Medications Prior to Visit  Medication Sig Dispense Refill  . albuterol (PROVENTIL HFA;VENTOLIN HFA) 108 (90 Base) MCG/ACT inhaler Inhale 2 puffs into the lungs every 4 (four) hours as needed for wheezing or shortness of breath. 1 Inhaler 0  . cyclobenzaprine (FLEXERIL) 10 MG tablet Take 10 mg by mouth at bedtime.    Marland Kitchen FLUoxetine (PROZAC) 20 MG tablet TAKE 1 AND 1/2 TABLETS BY MOUTH EACH DAY FOR DEPRESSION OR ANXIETY    . ibuprofen (ADVIL) 200 MG tablet Take 200 mg by mouth  as needed.    Marland Kitchen levonorgestrel (MIRENA) 20 MCG/24HR IUD 1 each by Intrauterine route once.    . ondansetron (ZOFRAN) 4 MG tablet as needed.    Marland Kitchen oxyCODONE-acetaminophen (PERCOCET/ROXICET) 5-325 MG tablet SMARTSIG:1 Tablet(s) By Mouth 1 to 3 Times Daily    . pantoprazole (PROTONIX) 20 MG tablet Take 20 mg by mouth daily.     No facility-administered medications prior to visit.    PAST MEDICAL HISTORY: Past Medical History:  Diagnosis Date  . Anxiety   . GERD (gastroesophageal reflux disease)   . Hx of iron deficiency anemia   . Stress at home     PAST SURGICAL HISTORY: Past Surgical History:  Procedure Laterality Date  . CESAREAN SECTION  2008/2012   x2  . COLONOSCOPY WITH ESOPHAGOGASTRODUODENOSCOPY (EGD) N/A 11/04/2012   QVZ:DGLOVF mucosa in the terminal ileum Normal colon/RECTAL BLEEDING DUE TO Moderate sized internal hemorrhoids/MILD Non-erosive gastritis  . ESOPHAGOGASTRODUODENOSCOPY N/A 12/24/2012   IEP:PIRJJO esophagus/ LIMITED VIEW OF GASTRIC MUCOSA DUE TO RETAINED GASTRIC CONTENTS    FAMILY HISTORY: Family History  Adopted: Yes  Problem Relation Age of Onset  . Colon cancer Neg Hx     SOCIAL HISTORY: Social History   Socioeconomic History  . Marital status: Divorced    Spouse name: Not on file  . Number of children: 2  . Years of education: Not on file  . Highest education level: Some college, no degree  Occupational History    Comment: home maker  Tobacco Use  . Smoking status: Former Smoker    Packs/day: 0.50    Years: 3.00    Pack years: 1.50    Types: Cigarettes    Quit date: 05/06/2012    Years since quitting: 8.0  . Smokeless tobacco: Never Used  . Tobacco comment: quit Jan 2014   Substance and Sexual Activity  . Alcohol use: No  . Drug use: No  . Sexual activity: Yes    Birth control/protection: Inserts    Comment: Mirena  Other Topics Concern  . Not on file  Social History Narrative   59-year-old   25-year-old          Social Determinants  of Health   Financial Resource Strain: Not on file  Food Insecurity: Not on file  Transportation Needs: Not on file  Physical Activity: Not on file  Stress: Not on file  Social Connections: Not on file  Intimate Partner Violence: Not on file     PHYSICAL EXAM  GENERAL EXAM/CONSTITUTIONAL: Vitals:  Vitals:   05/29/20 1512  BP: (!) 149/103  Pulse: (!) 121  Weight: 135 lb (61.2 kg)  Height: 5\' 1"  (1.549 m)     Body mass index is 25.51 kg/m. Wt Readings from Last 3 Encounters:  05/29/20 135 lb (61.2 kg)  04/19/19 140 lb 3.2 oz (63.6 kg)  02/04/19 130 lb (59 kg)     Patient is in no distress; well developed, nourished and groomed; neck is supple  CARDIOVASCULAR:  Examination of carotid arteries is normal; no carotid bruits  Regular rate and rhythm, no murmurs  Examination of peripheral vascular system by observation and palpation is normal  EYES:  Ophthalmoscopic exam of optic discs and posterior segments is normal; no papilledema or hemorrhages  No exam data present  MUSCULOSKELETAL:  Gait, strength, tone, movements noted in Neurologic exam below  NEUROLOGIC: MENTAL STATUS:  No flowsheet data found.  awake, alert, oriented to person, place and time  recent and remote memory intact  normal attention and concentration  language fluent, comprehension intact, naming intact  fund of knowledge appropriate  CRANIAL NERVE:   2nd - no papilledema on fundoscopic exam  2nd, 3rd, 4th, 6th - pupils equal and reactive to light, visual fields full to confrontation, extraocular muscles intact, no nystagmus  5th - facial sensation symmetric  7th - facial strength symmetric  8th - hearing intact  9th - palate elevates symmetrically, uvula midline  11th - shoulder shrug symmetric  12th - tongue protrusion midline  MOTOR:   normal bulk and tone, full strength in the BUE, BLE  SENSORY:   normal and symmetric to light touch, temperature,  vibration  COORDINATION:   finger-nose-finger, fine finger movements normal  REFLEXES:   deep tendon reflexes present and symmetric  GAIT/STATION:   narrow based gait     DIAGNOSTIC DATA (LABS, IMAGING, TESTING) - I reviewed patient records, labs, notes, testing and imaging myself where available.  Lab Results  Component Value Date   WBC 5.4 02/09/2020   HGB 12.3 02/09/2020   HCT 38.0 02/09/2020   MCV 89.4 02/09/2020   PLT 267 02/09/2020      Component Value Date/Time   NA 136 02/09/2020 1525   K 4.2 02/09/2020 1525   CL 104 02/09/2020 1525   CO2 24 02/09/2020 1525   GLUCOSE 103 (H) 02/09/2020 1525   BUN 10 02/09/2020 1525   BUN 11 10/07/2012 0000   CREATININE 0.83 02/09/2020 1525   CREATININE 0.83 10/07/2012 0000  CALCIUM 9.0 02/09/2020 1525   CALCIUM 9.4 10/07/2012 0000   PROT 7.6 02/09/2020 1525   PROT 7.0 10/07/2012 0000   ALBUMIN 4.1 02/09/2020 1525   ALBUMIN 4.8 10/07/2012 0000   AST 14 (L) 02/09/2020 1525   AST 14 10/07/2012 0000   ALT 20 02/09/2020 1525   ALKPHOS 55 02/09/2020 1525   ALKPHOS 48 10/07/2012 0000   BILITOT 0.5 02/09/2020 1525   BILITOT 0.5 10/07/2012 0000   GFRNONAA >60 02/09/2020 1525   GFRAA >60 02/27/2017 1735   No results found for: CHOL, HDL, LDLCALC, LDLDIRECT, TRIG, CHOLHDL No results found for: JGGE3M No results found for: VITAMINB12 Lab Results  Component Value Date   TSH 4.692 (H) 10/29/2012    04/13/20 MRI cervical spine [I reviewed images myself. Borderline cerebellar tonsillar ectopia 5-47mm, mainly on the right side. -VRP]  1. Central disc protrusion with uncovertebral hypertrophy at C6-7 with resultant moderate spinal stenosis, with moderate bilateral C7 foraminal stenosis. 2. Right paracentral disc protrusion at C5-6 with resultant mild to moderate spinal stenosis, and flattening of the right hemi cord. Moderate left with mild right C6 foraminal narrowing related to uncovertebral disease. 3. 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    ASSESSMENT AND PLAN  35 y.o. year old female here with:  Dx:  1. 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 MR BRAIN W WO CONTRAST   Return for pending if symptoms worsen or fail to improve.    Suanne Marker, MD 05/29/2020, 3:19 PM Certified in Neurology, Neurophysiology and Neuroimaging  [facility] [address], [address] [place], [place] [address] [phone]

## 2020-05-30 ENCOUNTER — Telehealth: Payer: Self-pay | Admitting: Diagnostic Neuroimaging

## 2020-05-30 NOTE — Telephone Encounter (Signed)
mcd healthy blue pending. It can take up to 15 business days to hear on an approval or not.

## 2020-06-01 NOTE — Telephone Encounter (Signed)
Check the status on the portal it is still pending.

## 2020-06-07 NOTE — Telephone Encounter (Signed)
Checked the status on the portal it is still pending.  

## 2020-06-09 DIAGNOSIS — U071 COVID-19: Secondary | ICD-10-CM

## 2020-06-09 HISTORY — DX: COVID-19: U07.1

## 2020-06-12 NOTE — Telephone Encounter (Signed)
Checked status on the portal it is still pending.  

## 2020-06-13 NOTE — Telephone Encounter (Signed)
mcd healthy blue Berkley Harvey: [id number] (exp. 05/30/20 to 07/29/20) patient is scheduled at [facility] for Monday 06/26/20 to arrive at 2:30 pm. Patient is aware of time and day. I also gave her their number of [phone] incase she needed to r/s.  Also, I spoke with Kylie with the pre services pre cert ph # of [phone] opt 2 at [facility]. She said it is okay for her to have her MRI at [facility] under the [facility] NPI just make sure it is the [facility] N[facility].

## 2020-06-26 ENCOUNTER — Ambulatory Visit (HOSPITAL_COMMUNITY)
Admission: RE | Admit: 2020-06-26 | Discharge: 2020-06-26 | Disposition: A | Discharge status: Home / Self Care | Payer: Medicaid Other | Source: Ambulatory Visit | Attending: Diagnostic Neuroimaging | Admitting: Diagnostic Neuroimaging

## 2020-06-26 ENCOUNTER — Other Ambulatory Visit: Payer: Self-pay

## 2020-06-26 DIAGNOSIS — R519 Headache, unspecified: Secondary | ICD-10-CM | POA: Diagnosis present

## 2020-06-26 MED ORDER — GADOBUTROL 1 MMOL/ML IV SOLN
7.0000 mL | Freq: Once | INTRAVENOUS | Status: AC | PRN
  Administered 2020-06-26: 7 mL via INTRAVENOUS

## 2020-07-03 ENCOUNTER — Encounter: Payer: Self-pay | Admitting: *Deleted

## 2020-08-09 ENCOUNTER — Encounter: Payer: Self-pay | Admitting: *Deleted

## 2020-08-14 ENCOUNTER — Ambulatory Visit: Payer: Medicaid Other | Admitting: Diagnostic Neuroimaging

## 2020-09-05 ENCOUNTER — Ambulatory Visit: Payer: Medicaid Other | Admitting: Diagnostic Neuroimaging

## 2020-09-26 ENCOUNTER — Ambulatory Visit: Payer: Medicaid Other | Admitting: Diagnostic Neuroimaging

## 2020-11-20 ENCOUNTER — Ambulatory Visit: Payer: Medicaid Other | Admitting: Diagnostic Neuroimaging

## 2020-11-20 ENCOUNTER — Telehealth: Payer: Self-pay | Admitting: Diagnostic Neuroimaging

## 2020-11-20 NOTE — Telephone Encounter (Signed)
Pt rescheduled her visit that was scheduled for today, said she was not feeling well.

## 2020-11-20 NOTE — Telephone Encounter (Signed)
Noted  

## 2021-01-02 ENCOUNTER — Ambulatory Visit: Payer: Medicaid Other | Admitting: Diagnostic Neuroimaging

## 2021-01-02 ENCOUNTER — Encounter: Payer: Self-pay | Admitting: Diagnostic Neuroimaging

## 2021-01-02 VITALS — BP 120/83 | HR 75 | Ht 61.0 in | Wt 123.2 lb

## 2021-01-02 DIAGNOSIS — G44209 Tension-type headache, unspecified, not intractable: Secondary | ICD-10-CM

## 2021-01-02 DIAGNOSIS — G4486 Cervicogenic headache: Secondary | ICD-10-CM

## 2021-01-02 NOTE — Progress Notes (Signed)
[facility]  PATIENT: [name] [name] DOB: 01-31-1986  REFERRING CLINICIAN: John Giovanni, MD HISTORY FROM: patient  REASON FOR VISIT: follow up   HISTORICAL  CHIEF COMPLAINT:  Chief Complaint  Patient presents with   Follow-up    Rm 6 with Chrissie Noa [facility]) here for f/u on headaches. Reports she headaches are about the same. No big improvement noted. Has noticed moving her head to the left, standing up suddenly, and going over bumps in the car are triggers for her headaches.     HISTORY OF PRESENT ILLNESS:   UPDATE (01/02/21, VRP): Since last visit, doing about the same. Symptoms are stable. Neck and global HA pain. No other issues. Using oxycodone per PCP.  PRIOR HPI (05/29/20): 35 year old female here for evaluation of headache and neck pain.  September 2021 patient had second dose of Covid vaccine and the following day she felt pain and numbness in her head, neck, arms and legs.  Now patient continues to have daily headache in her occipital region on the right side, radiating to her whole head, sometimes associated with nausea.  No vision changes no sensitive to light or sound.  Also having neck pain, arm and leg pain and numbness.  No history of migraines.  Patient had MRI of the cervical spine which showed mild to moderate spinal stenosis but no cord signal abnormality.  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  She went to neurosurgery clinic for evaluation who recommended neurology follow-up.   REVIEW OF SYSTEMS: Full 14 system review of systems performed and negative with exception of: As per HPI.  ALLERGIES: Allergies  Allergen Reactions   Bee Venom Swelling   Penicillins Other (See Comments)    REACTION: Unknown    HOME MEDICATIONS: Outpatient Medications Prior to Visit  Medication Sig Dispense Refill   albuterol (PROVENTIL HFA;VENTOLIN HFA) 108 (90 Base) MCG/ACT inhaler Inhale 2 puffs into the  lungs every 4 (four) hours as needed for wheezing or shortness of breath. 1 Inhaler 0   cyclobenzaprine (FLEXERIL) 10 MG tablet Take 10 mg by mouth at bedtime.     FLUoxetine (PROZAC) 20 MG tablet TAKE 1 AND 1/2 TABLETS BY MOUTH EACH DAY FOR DEPRESSION OR ANXIETY     ibuprofen (ADVIL) 200 MG tablet Take 200 mg by mouth as needed.     levonorgestrel (MIRENA) 20 MCG/24HR IUD 1 each by Intrauterine route once.     ondansetron (ZOFRAN) 4 MG tablet as needed.     oxyCODONE-acetaminophen (PERCOCET/ROXICET) 5-325 MG tablet SMARTSIG:1 Tablet(s) By Mouth 1 to 3 Times Daily     pantoprazole (PROTONIX) 20 MG tablet Take 20 mg by mouth daily.     No facility-administered medications prior to visit.    PAST MEDICAL HISTORY: Past Medical History:  Diagnosis Date   Anxiety    COVID 06/09/2020   GERD (gastroesophageal reflux disease)    Hx of iron deficiency anemia    Stress at home     PAST SURGICAL HISTORY: Past Surgical History:  Procedure Laterality Date   CESAREAN SECTION  2008/2012   x2   COLONOSCOPY WITH ESOPHAGOGASTRODUODENOSCOPY (EGD) N/A 11/04/2012   NID:POEUMP mucosa in the terminal ileum Normal colon/RECTAL BLEEDING DUE TO Moderate sized internal hemorrhoids/MILD Non-erosive gastritis   ESOPHAGOGASTRODUODENOSCOPY N/A 12/24/2012   NTI:RWERXV esophagus/ LIMITED VIEW OF GASTRIC MUCOSA DUE TO RETAINED GASTRIC CONTENTS    FAMILY HISTORY: Family History  Adopted: Yes  Problem Relation Age of Onset   Colon cancer  Neg Hx     SOCIAL HISTORY: Social History   Socioeconomic History   Marital status: Divorced    Spouse name: Not on file   Number of children: 2   Years of education: Not on file   Highest education level: Some college, no degree  Occupational History    Comment: home maker  Tobacco Use   Smoking status: Former    Packs/day: 0.50    Years: 3.00    Pack years: 1.50    Types: Cigarettes    Quit date: 05/06/2012    Years since quitting: 8.6   Smokeless tobacco: Never    Tobacco comments:    quit Jan 2014   Substance and Sexual Activity   Alcohol use: No   Drug use: No   Sexual activity: Yes    Birth control/protection: Inserts    Comment: Mirena  Other Topics Concern   Not on file  Social History Narrative   23-year-old   9-year-old          Social Determinants of Health   Financial Resource Strain: Not on file  Food Insecurity: Not on file  Transportation Needs: Not on file  Physical Activity: Not on file  Stress: Not on file  Social Connections: Not on file  Intimate Partner Violence: Not on file     PHYSICAL EXAM  GENERAL EXAM/CONSTITUTIONAL: Vitals:  Vitals:   01/02/21 1103  BP: 120/83  Pulse: 75  Weight: 123 lb 4 oz (55.9 kg)  Height: 5\' 1"  (1.549 m)   Body mass index is 23.29 kg/m. Wt Readings from Last 3 Encounters:  01/02/21 123 lb 4 oz (55.9 kg)  05/29/20 135 lb (61.2 kg)  04/19/19 140 lb 3.2 oz (63.6 kg)   Patient is in no distress; well developed, nourished and groomed; neck is supple  CARDIOVASCULAR: Examination of carotid arteries is normal; no carotid bruits Regular rate and rhythm, no murmurs Examination of peripheral vascular system by observation and palpation is normal  EYES: Ophthalmoscopic exam of optic discs and posterior segments is normal; no papilledema or hemorrhages No results found.  MUSCULOSKELETAL: Gait, strength, tone, movements noted in Neurologic exam below  NEUROLOGIC: MENTAL STATUS:  No flowsheet data found. awake, alert, oriented to person, place and time recent and remote memory intact normal attention and concentration language fluent, comprehension intact, naming intact fund of knowledge appropriate  CRANIAL NERVE:  2nd - no papilledema on fundoscopic exam 2nd, 3rd, 4th, 6th - pupils equal and reactive to light, visual fields full to confrontation, extraocular muscles intact, no nystagmus 5th - facial sensation symmetric 7th - facial strength symmetric 8th - hearing  intact 9th - palate elevates symmetrically, uvula midline 11th - shoulder shrug symmetric 12th - tongue protrusion midline  MOTOR:  normal bulk and tone, full strength in the BUE, BLE  SENSORY:  normal and symmetric to light touch, temperature, vibration  COORDINATION:  finger-nose-finger, fine finger movements normal  REFLEXES:  deep tendon reflexes present and symmetric  GAIT/STATION:  narrow based gait     DIAGNOSTIC DATA (LABS, IMAGING, TESTING) - I reviewed patient records, labs, notes, testing and imaging myself where available.  Lab Results  Component Value Date   WBC 5.4 02/09/2020   HGB 12.3 02/09/2020   HCT 38.0 02/09/2020   MCV 89.4 02/09/2020   PLT 267 02/09/2020      Component Value Date/Time   NA 136 02/09/2020 1525   K 4.2 02/09/2020 1525   CL 104 02/09/2020 1525   CO2  24 02/09/2020 1525   GLUCOSE 103 (H) 02/09/2020 1525   BUN 10 02/09/2020 1525   BUN 11 10/07/2012 0000   CREATININE 0.83 02/09/2020 1525   CREATININE 0.83 10/07/2012 0000   CALCIUM 9.0 02/09/2020 1525   CALCIUM 9.4 10/07/2012 0000   PROT 7.6 02/09/2020 1525   PROT 7.0 10/07/2012 0000   ALBUMIN 4.1 02/09/2020 1525   ALBUMIN 4.8 10/07/2012 0000   AST 14 (L) 02/09/2020 1525   AST 14 10/07/2012 0000   ALT 20 02/09/2020 1525   ALKPHOS 55 02/09/2020 1525   ALKPHOS 48 10/07/2012 0000   BILITOT 0.5 02/09/2020 1525   BILITOT 0.5 10/07/2012 0000   GFRNONAA >60 02/09/2020 1525   GFRAA >60 02/27/2017 1735   No results found for: CHOL, HDL, LDLCALC, LDLDIRECT, TRIG, CHOLHDL No results found for: PJKD3O No results found for: VITAMINB12 Lab Results  Component Value Date   TSH 4.692 (H) 10/29/2012    04/13/20 MRI cervical spine [I reviewed images myself. 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 -VRP]  1. Central disc protrusion with uncovertebral hypertrophy at C6-7 with resultant moderate spinal stenosis, with moderate bilateral C7 foraminal  stenosis. 2. Right paracentral disc protrusion at C5-6 with resultant mild to moderate spinal stenosis, and flattening of the right hemi cord. Moderate left with mild right C6 foraminal narrowing related to uncovertebral disease. 3. 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  06/26/20 MRI brain: 1. No evidence of acute intracranial abnormality. 2. Borderline Chiari I malformation with the cerebellar tonsils extending up to approximately 5 mm below the foramen magnum with a somewhat beaked appearance. No hydrocephalus.  04/13/20 MRI cervical spine 1. Central disc protrusion with uncovertebral hypertrophy at C6-7 with resultant moderate spinal stenosis, with moderate bilateral C7 foraminal stenosis. 2. Right paracentral disc protrusion at C5-6 with resultant mild to moderate spinal stenosis, and flattening of the right hemi cord. Moderate left with mild right C6 foraminal narrowing related to uncovertebral disease. 3. 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    ASSESSMENT AND PLAN  35 y.o. year old female here with:  Dx:  1. Tension headache   2. 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    Suanne Marker, MD 01/02/2021, 11:44 AM Certified in Neurology, Neurophysiology and Neuroimaging  [facility] [address], [address] [place], [place] [address] [phone]

## 2021-01-02 NOTE — Patient Instructions (Signed)
  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

## 2021-07-07 IMAGING — DX DG LUMBAR SPINE COMPLETE 4+V
5 series · 5 of 5 positions shown · non-contrast
Comparison: None.

CLINICAL DATA: Bilateral mid to lower back pain

EXAM:
LUMBAR SPINE - COMPLETE 4+ VIEW

[l-spine ap]
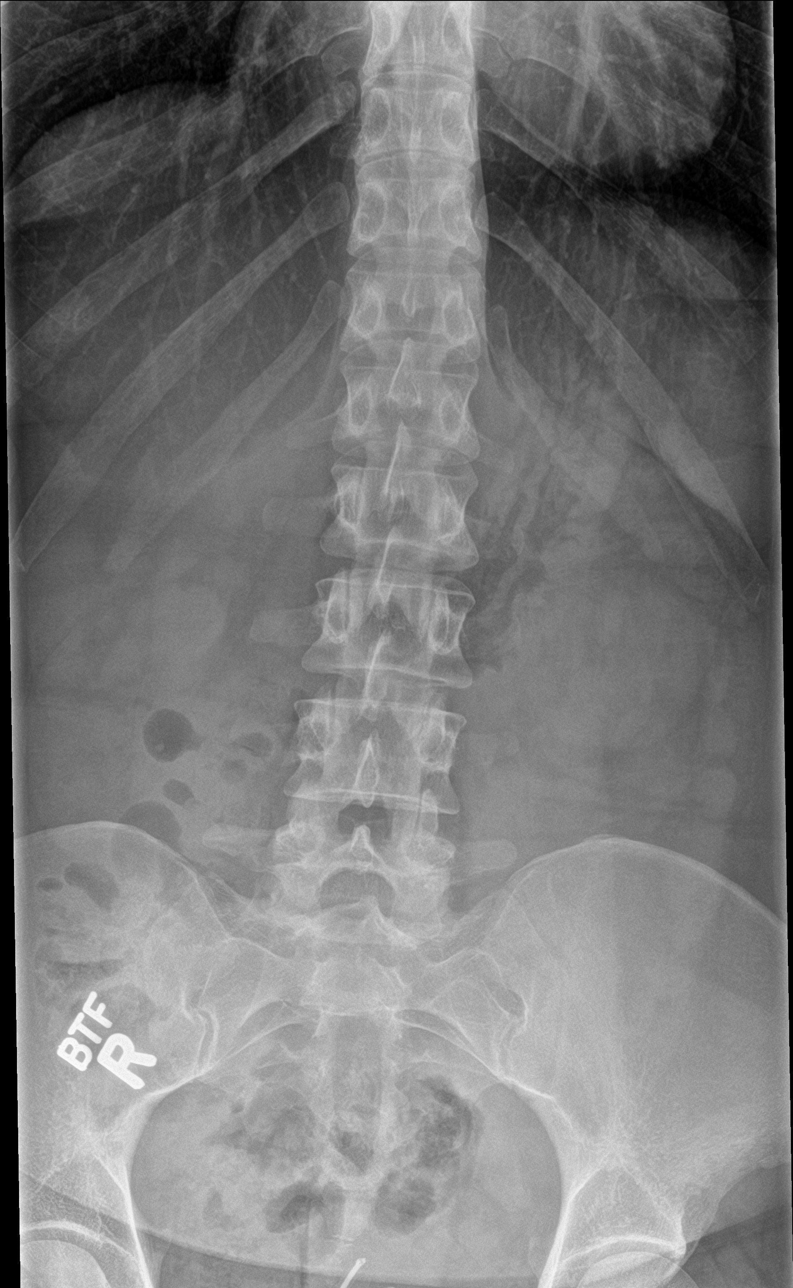

[l-spine obl (1 of 2)]
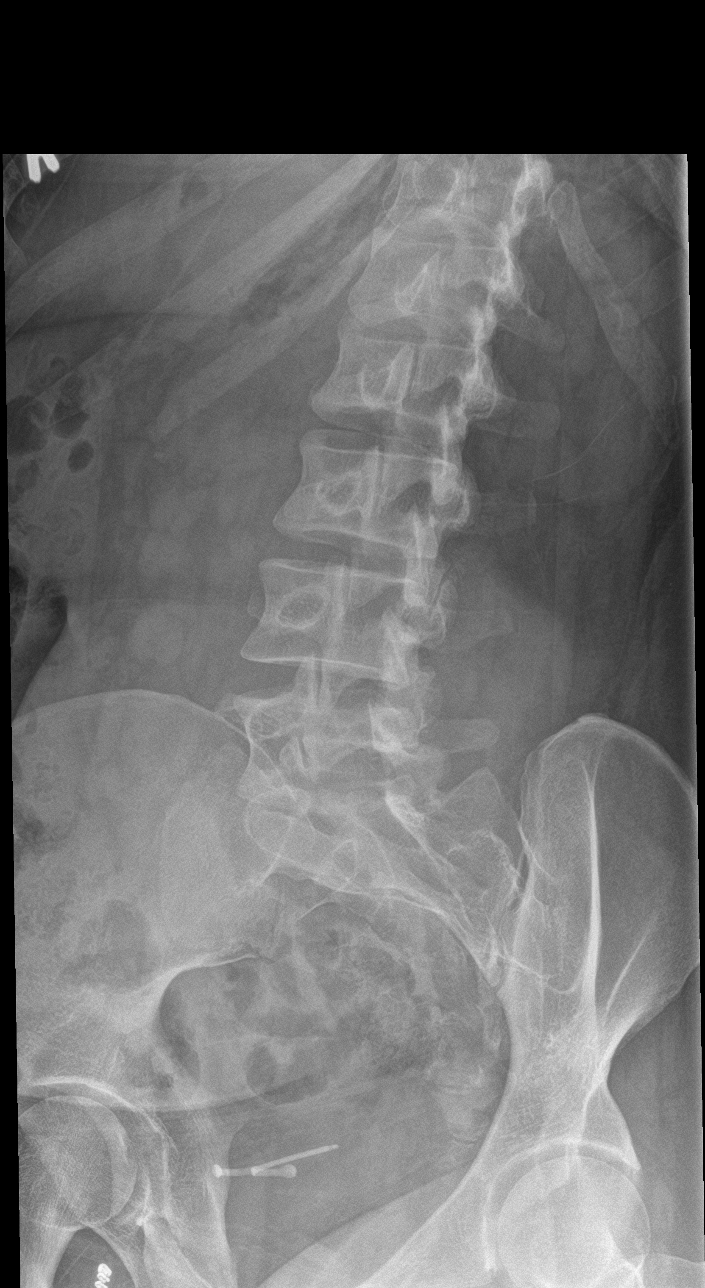

[l-spine obl (2 of 2)]
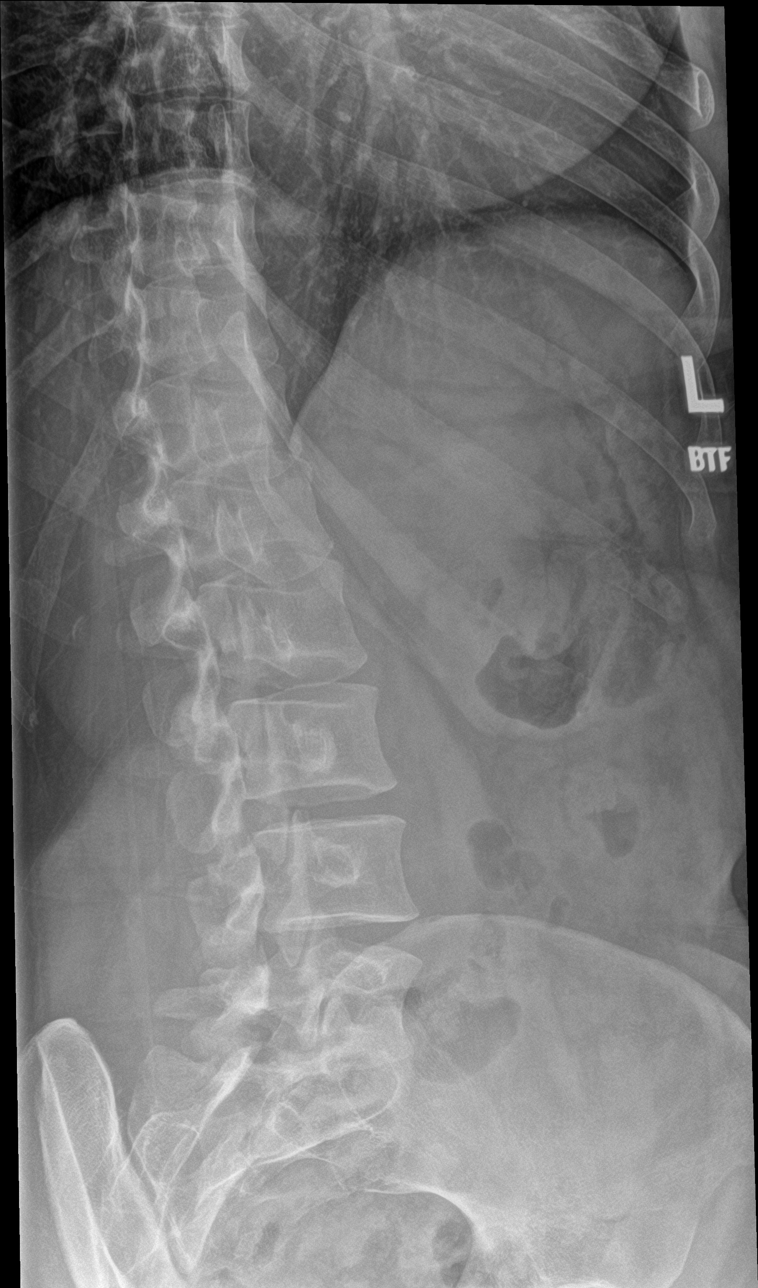

[l-spine lat]
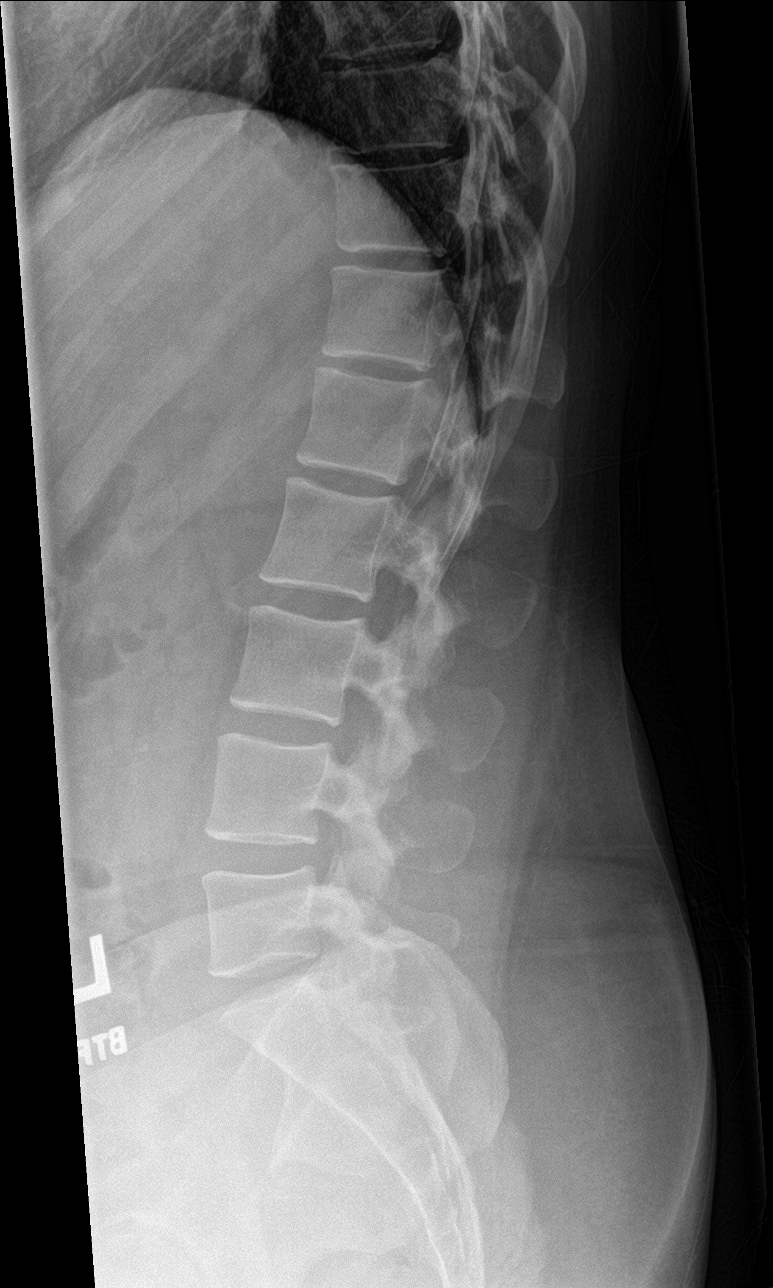

[l-spine spot]
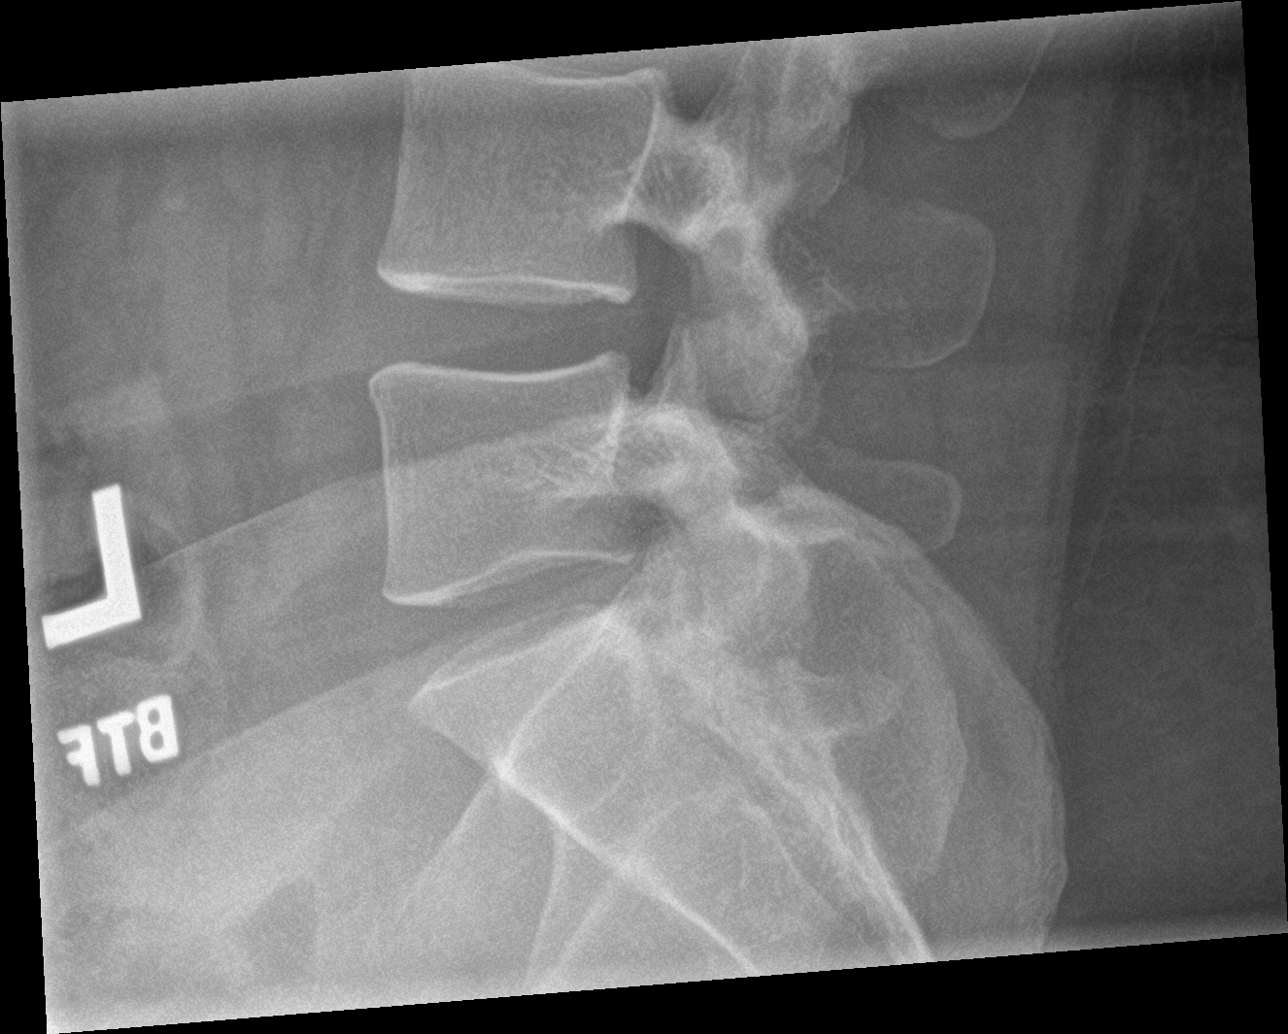

[5 of 5 positions shown; findings below may reference images not displayed]

FINDINGS: There is no evidence of lumbar spine fracture. Alignment is normal.
Intervertebral disc spaces are maintained.
IMPRESSION: Negative.

## 2021-07-24 ENCOUNTER — Other Ambulatory Visit (HOSPITAL_COMMUNITY)
Admission: RE | Admit: 2021-07-24 | Discharge: 2021-07-24 | Disposition: A | Discharge status: Home / Self Care | Payer: Medicaid Other | Source: Other Acute Inpatient Hospital | Attending: Family Medicine | Admitting: Family Medicine

## 2021-07-24 DIAGNOSIS — Z79891 Long term (current) use of opiate analgesic: Secondary | ICD-10-CM | POA: Insufficient documentation

## 2021-07-24 LAB — RAPID URINE DRUG SCREEN, HOSP PERFORMED
Amphetamines: NOT DETECTED
Barbiturates: NOT DETECTED
Benzodiazepines: NOT DETECTED
Cocaine: NOT DETECTED
Opiates: NOT DETECTED
Tetrahydrocannabinol: NOT DETECTED

## 2023-01-09 ENCOUNTER — Emergency Department (HOSPITAL_COMMUNITY): Payer: Medicaid Other

## 2023-01-09 ENCOUNTER — Other Ambulatory Visit: Payer: Self-pay

## 2023-01-09 ENCOUNTER — Encounter (HOSPITAL_COMMUNITY): Payer: Self-pay | Admitting: Emergency Medicine

## 2023-01-09 ENCOUNTER — Emergency Department (HOSPITAL_COMMUNITY)
Admission: EM | Admit: 2023-01-09 | Discharge: 2023-01-10 | Disposition: A | Discharge status: Home / Self Care | Payer: Medicaid Other | Attending: Emergency Medicine | Admitting: Emergency Medicine

## 2023-01-09 DIAGNOSIS — M5412 Radiculopathy, cervical region: Secondary | ICD-10-CM | POA: Diagnosis not present

## 2023-01-09 DIAGNOSIS — R202 Paresthesia of skin: Secondary | ICD-10-CM | POA: Insufficient documentation

## 2023-01-09 DIAGNOSIS — M542 Cervicalgia: Secondary | ICD-10-CM | POA: Diagnosis present

## 2023-01-09 DIAGNOSIS — R531 Weakness: Secondary | ICD-10-CM | POA: Diagnosis not present

## 2023-01-09 MED ORDER — METHOCARBAMOL 1000 MG/10ML IJ SOLN
1000.0000 mg | Freq: Once | INTRAVENOUS | Status: AC
  Administered 2023-01-10: 1000 mg via INTRAVENOUS
  Filled 2023-01-09: qty 10

## 2023-01-09 MED ORDER — METHYLPREDNISOLONE SODIUM SUCC 125 MG IJ SOLR
60.0000 mg | Freq: Once | INTRAMUSCULAR | Status: AC
  Administered 2023-01-10: 60 mg via INTRAVENOUS
  Filled 2023-01-09: qty 2

## 2023-01-09 MED ORDER — ACETAMINOPHEN 325 MG PO TABS
650.0000 mg | ORAL_TABLET | Freq: Once | ORAL | Status: AC
  Administered 2023-01-10: 650 mg via ORAL
  Filled 2023-01-09: qty 2

## 2023-01-09 MED ORDER — KETOROLAC TROMETHAMINE 30 MG/ML IJ SOLN
30.0000 mg | Freq: Once | INTRAMUSCULAR | Status: AC
  Administered 2023-01-10: 30 mg via INTRAVENOUS
  Filled 2023-01-09: qty 1

## 2023-01-09 MED ORDER — METHOCARBAMOL 1000 MG/10ML IJ SOLN
INTRAMUSCULAR | Status: AC
  Filled 2023-01-09: qty 10

## 2023-01-09 NOTE — ED Provider Notes (Signed)
[place] EMERGENCY DEPARTMENT AT [facility] Provider Note   CSN: [id number] Arrival date & time: 01/09/23  2002     History  Chief complaint: Neck pain  JOHNELL RIGANO is a 37 y.o. female.  The history is provided by the patient.  She complains of pain in the left side of her neck radiating to her left shoulder for the last 2 weeks, getting worse.  At times, pain has radiated down into her left upper arm as far down as her elbow.  She is also complaining of numbness on the lateral aspect of her left arm and on the palmar surface of the left hand.  She has had some numbness in her left leg as well and has noted at times there was some mild weakness in her left leg as manifest by difficulty going up steps.  She has not noticed any weakness of her left arm.  She denies bowel or bladder dysfunction.  She has taken over-the-counter ibuprofen, acetaminophen and prescription oxycodone without relief.  She has tried applying ice and heat without relief.   Home Medications Prior to Admission medications   Medication Sig Start Date End Date Taking? Authorizing Provider  albuterol (PROVENTIL HFA;VENTOLIN HFA) 108 (90 Base) MCG/ACT inhaler Inhale 2 puffs into the lungs every 4 (four) hours as needed for wheezing or shortness of breath. 08/11/17   Samuel Jester, DO  cyclobenzaprine (FLEXERIL) 10 MG tablet Take 10 mg by mouth at bedtime. 05/05/20   [provider]  FLUoxetine (PROZAC) 20 MG tablet TAKE 1 AND 1/2 TABLETS BY MOUTH EACH DAY FOR DEPRESSION OR ANXIETY 05/17/20   [provider]  ibuprofen (ADVIL) 200 MG tablet Take 200 mg by mouth as needed.    [provider]  levonorgestrel (MIRENA) 20 MCG/24HR IUD 1 each by Intrauterine route once.    [provider]  ondansetron (ZOFRAN) 4 MG tablet as needed. 03/08/20   [provider]  oxyCODONE-acetaminophen (PERCOCET/ROXICET) 5-325 MG tablet SMARTSIG:1 Tablet(s) By Mouth 1 to 3 Times Daily  05/22/20   [provider]  pantoprazole (PROTONIX) 20 MG tablet Take 20 mg by mouth daily.    [provider]      Allergies    Bee venom and Penicillins    Review of Systems   Review of Systems  All other systems reviewed and are negative.   Physical Exam Updated Vital Signs BP (!) 158/100   Pulse (!) 109   Temp 99 F (37.2 C) (Oral)   Resp 18   Ht 5' (1.524 m)   Wt 56.7 kg   SpO2 100%   BMI 24.41 kg/m  Physical Exam Vitals and nursing note reviewed.   37 year old female, appears uncomfortable, but is in no acute distress. Vital signs are significant for elevated blood pressure and elevated heart rate. Oxygen saturation is 100%, which is normal. Head is normocephalic and atraumatic. PERRLA, EOMI. Oropharynx is clear. Neck is tender in the midline and to the left of the midline in the mid and lower cervical region.  There is moderate left paracervical muscle spasm. Back is nontender and there is no CVA tenderness. Lungs are clear without rales, wheezes, or rhonchi. Chest is nontender. Heart has regular rate and rhythm without murmur. Abdomen is soft, flat, nontender. Extremities have no cyanosis or edema, full range of motion is present. Skin is warm and dry without rash. Neurologic: Mental status is normal, cranial nerves are intact, strength is 5/5 in all muscle  groups, no objective sensory deficit.  Muscle groups tested include shoulder abduction, elbow flexion, elbow extension, wrist flexion, wrist extension, finger abduction, finger adduction, pincer grasp, hip flexion, knee flexion, knee extension, dorsiflexion, plantarflexion.  ED Results / Procedures / Treatments    Radiology CT Cervical Spine Wo Contrast  Result Date: 01/09/2023 CLINICAL DATA:  Cervical radiculopathy, no red flags; Neuro deficit, acute, stroke suspected Pt has had neck pain for past 2 weeks and home medications have not been relieving symptoms. Pt states they are having  pain/numbness going into left arm. Pt has a hx of bone spurs in neck. EXAM: CT HEAD WITHOUT CONTRAST CT CERVICAL SPINE WITHOUT CONTRAST TECHNIQUE: Multidetector CT imaging of the head and cervical spine was performed following the standard protocol without intravenous contrast. Multiplanar CT image reconstructions of the cervical spine were also generated. RADIATION DOSE REDUCTION: This exam was performed according to the departmental dose-optimization program which includes automated exposure control, adjustment of the mA and/or kV according to patient size and/or use of iterative reconstruction technique. COMPARISON:  None Available. FINDINGS: CT HEAD FINDINGS Brain: No evidence of large-territorial acute infarction. No parenchymal hemorrhage. No mass lesion. No extra-axial collection. No mass effect or midline shift. No hydrocephalus. Basilar cisterns are patent. Vascular: No hyperdense vessel. Skull: No acute fracture or focal lesion. Sinuses/Orbits: Paranasal sinuses and mastoid air cells are clear. The orbits are unremarkable. Other: None. CT CERVICAL SPINE FINDINGS Alignment: Reversal normal cervical lordosis likely due to positioning and degenerative changes. Skull base and vertebrae: Mild degenerative changes. No severe osseous neural foraminal or central canal stenosis. No acute fracture. No aggressive appearing focal osseous lesion or focal pathologic process. Soft tissues and spinal canal: No prevertebral fluid or swelling. No visible canal hematoma. Upper chest: Unremarkable. Other: Left mandibular tooth periapical lucency. IMPRESSION: 1. No acute intracranial abnormality. 2. No acute displaced fracture or traumatic listhesis of the cervical spine. Electronically Signed   By: Tish Frederickson M.D.   On: 01/09/2023 23:46   CT Head Wo Contrast  Result Date: 01/09/2023 CLINICAL DATA:  Cervical radiculopathy, no red flags; Neuro deficit, acute, stroke suspected Pt has had neck pain for past 2 weeks and  home medications have not been relieving symptoms. Pt states they are having pain/numbness going into left arm. Pt has a hx of bone spurs in neck. EXAM: CT HEAD WITHOUT CONTRAST CT CERVICAL SPINE WITHOUT CONTRAST TECHNIQUE: Multidetector CT imaging of the head and cervical spine was performed following the standard protocol without intravenous contrast. Multiplanar CT image reconstructions of the cervical spine were also generated. RADIATION DOSE REDUCTION: This exam was performed according to the departmental dose-optimization program which includes automated exposure control, adjustment of the mA and/or kV according to patient size and/or use of iterative reconstruction technique. COMPARISON:  None Available. FINDINGS: CT HEAD FINDINGS Brain: No evidence of large-territorial acute infarction. No parenchymal hemorrhage. No mass lesion. No extra-axial collection. No mass effect or midline shift. No hydrocephalus. Basilar cisterns are patent. Vascular: No hyperdense vessel. Skull: No acute fracture or focal lesion. Sinuses/Orbits: Paranasal sinuses and mastoid air cells are clear. The orbits are unremarkable. Other: None. CT CERVICAL SPINE FINDINGS Alignment: Reversal normal cervical lordosis likely due to positioning and degenerative changes. Skull base and vertebrae: Mild degenerative changes. No severe osseous neural foraminal or central canal stenosis. No acute fracture. No aggressive appearing focal osseous lesion or focal pathologic process. Soft tissues and spinal canal: No prevertebral fluid or swelling. No visible canal hematoma. Upper chest: Unremarkable. Other: Left  mandibular tooth periapical lucency. IMPRESSION: 1. No acute intracranial abnormality. 2. No acute displaced fracture or traumatic listhesis of the cervical spine. Electronically Signed   By: Tish Frederickson M.D.   On: 01/09/2023 23:46    Procedures Procedures    Medications Ordered in ED Medications  ketorolac (TORADOL) 30 MG/ML  injection 30 mg (30 mg Intravenous Given 01/10/23 0019)  acetaminophen (TYLENOL) tablet 650 mg (650 mg Oral Given 01/10/23 0020)  methylPREDNISolone sodium succinate (SOLU-MEDROL) 125 mg/2 mL injection 60 mg (60 mg Intravenous Given 01/10/23 0019)  methocarbamol (ROBAXIN) 1,000 mg in dextrose 5 % 100 mL IVPB (1,000 mg Intravenous New Bag/Given 01/10/23 0030)    ED Course/ Medical Decision Making/ A&P                                 Medical Decision Making Amount and/or Complexity of Data Reviewed Radiology: ordered.  Risk OTC drugs. Prescription drug management.   Neck pain rating to the arm most likely cervical radiculopathy.  However, report of symptoms extending into the leg are concerning for possible intracranial lesion versus spinal cord lesion.  No objective neurologic findings.  I have ordered CT of head and cervical spine.  I have ordered intravenous ketorolac, methylprednisolone, methocarbamol and oral acetaminophen.  CT scans shows some degenerative changes of the cervical spine but no other obvious abnormality.  I have independently viewed the images, and agree with the radiologist's interpretation.  She did get moderate relief with above-noted treatment.  I am discharging her with prescription for methocarbamol and a short course of prednisone.  I am advising her to continue taking her oxycodone-acetaminophen and adding an over-the-counter NSAID such as ibuprofen or naproxen.  I have also encouraged her to optimize her acetaminophen dosing.  I have advised her to apply ice.  I am referring her to neurosurgery for follow-up.  Final Clinical Impression(s) / ED Diagnoses Final diagnoses:  Cervical radiculopathy    Rx / DC Orders ED Discharge Orders          Ordered    methocarbamol (ROBAXIN) 500 MG tablet  Every 6 hours PRN        01/10/23 0123    predniSONE (DELTASONE) 50 MG tablet  Daily        01/10/23 0123              Dione Booze, MD 01/10/23 0127

## 2023-01-09 NOTE — ED Triage Notes (Signed)
Pt to ed pov. Pt has had neck pain for past 2 weeks and home medications have not been relieving symptoms. Pt states they are having pain/numbness going into left arm. Pt has a hx of bone spurs in neck.

## 2023-01-10 MED ORDER — METHOCARBAMOL 500 MG PO TABS: 500.0000 mg | ORAL_TABLET | Freq: Four times a day (QID) | ORAL | 0 refills | Status: DC | PRN

## 2023-01-10 MED ORDER — METHOCARBAMOL 1000 MG/10ML IJ SOLN
INTRAMUSCULAR | Status: AC
  Filled 2023-01-10: qty 10

## 2023-01-10 MED ORDER — PREDNISONE 50 MG PO TABS: 50.0000 mg | ORAL_TABLET | Freq: Every day | ORAL | 0 refills | Status: DC

## 2023-01-10 NOTE — Discharge Instructions (Addendum)
You may continue applying ice to your neck.  Ice should be applied for 30 minutes at a time, 4 times a day.  You may continue taking your oxycodone-acetaminophen as needed for pain.  You may take ibuprofen or naproxen as needed for pain.  If you combine ibuprofen or naproxen with oxycodone-acetaminophen, you will get better pain relief and you get from taking either medication by itself.  To optimize your pain relief, you should increase your acetaminophen dose.  Take an acetaminophen 325 mg tablet along with your oxycodone-acetaminophen.  For less severe pain, you may combine acetaminophen 650 mg with ibuprofen or naproxen.

## 2023-08-10 ENCOUNTER — Emergency Department (HOSPITAL_COMMUNITY)

## 2023-08-10 ENCOUNTER — Encounter (HOSPITAL_COMMUNITY): Payer: Self-pay | Admitting: Obstetrics and Gynecology

## 2023-08-10 ENCOUNTER — Other Ambulatory Visit: Payer: Self-pay

## 2023-08-10 ENCOUNTER — Inpatient Hospital Stay (HOSPITAL_COMMUNITY)
Admission: EM | Admit: 2023-08-10 | Discharge: 2023-08-14 | DRG: 759 | Disposition: A | Discharge status: Home / Self Care | Attending: Obstetrics & Gynecology | Admitting: Obstetrics & Gynecology

## 2023-08-10 DIAGNOSIS — Z975 Presence of (intrauterine) contraceptive device: Secondary | ICD-10-CM

## 2023-08-10 DIAGNOSIS — F32A Depression, unspecified: Secondary | ICD-10-CM | POA: Diagnosis present

## 2023-08-10 DIAGNOSIS — Z880 Allergy status to penicillin: Secondary | ICD-10-CM | POA: Diagnosis not present

## 2023-08-10 DIAGNOSIS — Z87891 Personal history of nicotine dependence: Secondary | ICD-10-CM

## 2023-08-10 DIAGNOSIS — K219 Gastro-esophageal reflux disease without esophagitis: Secondary | ICD-10-CM | POA: Diagnosis not present

## 2023-08-10 DIAGNOSIS — R1084 Generalized abdominal pain: Secondary | ICD-10-CM | POA: Diagnosis present

## 2023-08-10 DIAGNOSIS — R188 Other ascites: Secondary | ICD-10-CM

## 2023-08-10 DIAGNOSIS — N730 Acute parametritis and pelvic cellulitis: Principal | ICD-10-CM | POA: Diagnosis present

## 2023-08-10 DIAGNOSIS — R102 Pelvic and perineal pain: Principal | ICD-10-CM

## 2023-08-10 DIAGNOSIS — E876 Hypokalemia: Secondary | ICD-10-CM | POA: Diagnosis present

## 2023-08-10 DIAGNOSIS — Z8616 Personal history of COVID-19: Secondary | ICD-10-CM | POA: Diagnosis not present

## 2023-08-10 DIAGNOSIS — K659 Peritonitis, unspecified: Secondary | ICD-10-CM | POA: Diagnosis present

## 2023-08-10 DIAGNOSIS — Z91030 Bee allergy status: Secondary | ICD-10-CM | POA: Diagnosis not present

## 2023-08-10 LAB — CBC WITH DIFFERENTIAL/PLATELET
Abs Immature Granulocytes: 0.02 10*3/uL (ref 0.00–0.07)
Basophils Absolute: 0 10*3/uL (ref 0.0–0.1)
Basophils Relative: 0 %
Eosinophils Absolute: 0 10*3/uL (ref 0.0–0.5)
Eosinophils Relative: 0 %
HCT: 38.5 % (ref 36.0–46.0)
Hemoglobin: 12.6 g/dL (ref 12.0–15.0)
Immature Granulocytes: 0 %
Lymphocytes Relative: 3 %
Lymphs Abs: 0.2 10*3/uL — ABNORMAL LOW (ref 0.7–4.0)
MCH: 29.7 pg (ref 26.0–34.0)
MCHC: 32.7 g/dL (ref 30.0–36.0)
MCV: 90.8 fL (ref 80.0–100.0)
Monocytes Absolute: 0.4 10*3/uL (ref 0.1–1.0)
Monocytes Relative: 5 %
Neutro Abs: 7.1 10*3/uL (ref 1.7–7.7)
Neutrophils Relative %: 92 %
Platelets: 286 10*3/uL (ref 150–400)
RBC: 4.24 MIL/uL (ref 3.87–5.11)
RDW: 13 % (ref 11.5–15.5)
WBC: 7.7 10*3/uL (ref 4.0–10.5)
nRBC: 0 % (ref 0.0–0.2)

## 2023-08-10 LAB — COMPREHENSIVE METABOLIC PANEL WITH GFR
ALT: 16 U/L (ref 0–44)
ALT: 14 U/L (ref 0–44)
AST: 19 U/L (ref 15–41)
AST: 18 U/L (ref 15–41)
Albumin: 3.7 g/dL (ref 3.5–5.0)
Albumin: 3.4 g/dL — ABNORMAL LOW (ref 3.5–5.0)
Alkaline Phosphatase: 48 U/L (ref 38–126)
Alkaline Phosphatase: 43 U/L (ref 38–126)
Anion gap: 12 (ref 5–15)
Anion gap: 9 (ref 5–15)
BUN: 14 mg/dL (ref 6–20)
BUN: 12 mg/dL (ref 6–20)
CO2: 25 mmol/L (ref 22–32)
CO2: 23 mmol/L (ref 22–32)
Calcium: 9.4 mg/dL (ref 8.9–10.3)
Calcium: 8.6 mg/dL — ABNORMAL LOW (ref 8.9–10.3)
Chloride: 99 mmol/L (ref 98–111)
Chloride: 105 mmol/L (ref 98–111)
Creatinine, Ser: 0.89 mg/dL (ref 0.44–1.00)
Creatinine, Ser: 0.88 mg/dL (ref 0.44–1.00)
GFR, Estimated: >60 mL/min (ref >60)
GFR, Estimated: >60 mL/min (ref >60)
Glucose, Bld: 132 mg/dL — ABNORMAL HIGH (ref 70–99)
Glucose, Bld: 109 mg/dL — ABNORMAL HIGH (ref 70–99)
Potassium: 3 mmol/L — ABNORMAL LOW (ref 3.5–5.1)
Potassium: 3.4 mmol/L — ABNORMAL LOW (ref 3.5–5.1)
Sodium: 136 mmol/L (ref 135–145)
Sodium: 137 mmol/L (ref 135–145)
Total Bilirubin: 1.1 mg/dL (ref 0.0–1.2)
Total Bilirubin: 0.7 mg/dL (ref 0.0–1.2)
Total Protein: 6.7 g/dL (ref 6.5–8.1)
Total Protein: 6.2 g/dL — ABNORMAL LOW (ref 6.5–8.1)

## 2023-08-10 LAB — URINALYSIS, ROUTINE W REFLEX MICROSCOPIC
Bilirubin Urine: NEGATIVE
Glucose, UA: NEGATIVE mg/dL
Hgb urine dipstick: NEGATIVE
Ketones, ur: 5 mg/dL — AB
Nitrite: NEGATIVE
Protein, ur: 100 mg/dL — AB
Specific Gravity, Urine: 1.027 (ref 1.005–1.030)
pH: 5 (ref 5.0–8.0)

## 2023-08-10 LAB — LACTIC ACID, PLASMA
Lactic Acid, Venous: 1.9 mmol/L (ref 0.5–1.9)
Lactic Acid, Venous: 1.6 mmol/L (ref 0.5–1.9)

## 2023-08-10 LAB — HIV ANTIBODY (ROUTINE TESTING W REFLEX): HIV Screen 4th Generation wRfx: NONREACTIVE

## 2023-08-10 LAB — LIPASE, BLOOD: Lipase: 23 U/L (ref 11–51)

## 2023-08-10 LAB — HCG, QUANTITATIVE, PREGNANCY: hCG, Beta Chain, Quant, S: <1 m[IU]/mL (ref <5)

## 2023-08-10 MED ORDER — HYDROMORPHONE HCL 1 MG/ML IJ SOLN
1.0000 mg | Freq: Once | INTRAMUSCULAR | Status: AC
  Administered 2023-08-10: 1 mg via INTRAVENOUS
  Filled 2023-08-10: qty 1

## 2023-08-10 MED ORDER — PRENATAL MULTIVITAMIN CH
1.0000 | ORAL_TABLET | Freq: Every day | ORAL | Status: DC
  Administered 2023-08-11 – 2023-08-13 (×3): 1 via ORAL
  Filled 2023-08-10 (×4): qty 1

## 2023-08-10 MED ORDER — POTASSIUM CHLORIDE 20 MEQ PO PACK
20.0000 meq | PACK | Freq: Two times a day (BID) | ORAL | Status: AC
  Administered 2023-08-10 – 2023-08-11 (×4): 20 meq via ORAL
  Filled 2023-08-10 (×4): qty 1

## 2023-08-10 MED ORDER — ONDANSETRON HCL 4 MG PO TABS: 4.0000 mg | ORAL_TABLET | Freq: Four times a day (QID) | ORAL | Status: DC | PRN

## 2023-08-10 MED ORDER — FLUOXETINE HCL 20 MG PO CAPS
30.0000 mg | ORAL_CAPSULE | Freq: Every day | ORAL | Status: DC
  Administered 2023-08-11 – 2023-08-14 (×4): 30 mg via ORAL
  Filled 2023-08-10 (×4): qty 1

## 2023-08-10 MED ORDER — ORAL CARE MOUTH RINSE: 15.0000 mL | OROMUCOSAL | Status: DC | PRN

## 2023-08-10 MED ORDER — POTASSIUM CHLORIDE 10 MEQ/100ML IV SOLN
10.0000 meq | Freq: Once | INTRAVENOUS | Status: AC
  Administered 2023-08-10: 10 meq via INTRAVENOUS
  Filled 2023-08-10: qty 100

## 2023-08-10 MED ORDER — POLYETHYLENE GLYCOL 3350 17 G PO PACK: 17.0000 g | PACK | Freq: Every day | ORAL | Status: DC | PRN

## 2023-08-10 MED ORDER — IOHEXOL 300 MG/ML  SOLN
100.0000 mL | Freq: Once | INTRAMUSCULAR | Status: AC | PRN
Start: 2023-08-10 — End: 2023-08-10
  Administered 2023-08-10: 100 mL via INTRAVENOUS

## 2023-08-10 MED ORDER — OXYCODONE HCL 5 MG PO TABS
10.0000 mg | ORAL_TABLET | ORAL | Status: DC | PRN
  Administered 2023-08-10 – 2023-08-12 (×7): 10 mg via ORAL
  Filled 2023-08-10 (×7): qty 2

## 2023-08-10 MED ORDER — DOXYCYCLINE HYCLATE 100 MG PO TABS
100.0000 mg | ORAL_TABLET | Freq: Two times a day (BID) | ORAL | Status: DC
  Administered 2023-08-10 – 2023-08-14 (×9): 100 mg via ORAL
  Filled 2023-08-10 (×9): qty 1

## 2023-08-10 MED ORDER — SODIUM CHLORIDE 0.9 % IV BOLUS
1000.0000 mL | Freq: Once | INTRAVENOUS | Status: AC
Start: 2023-08-10 — End: 2023-08-10
  Administered 2023-08-10: 1000 mL via INTRAVENOUS

## 2023-08-10 MED ORDER — SODIUM CHLORIDE 0.9 % IV SOLN: INTRAVENOUS | Status: AC

## 2023-08-10 MED ORDER — PANTOPRAZOLE SODIUM 40 MG IV SOLR
40.0000 mg | INTRAVENOUS | Status: AC
Start: 2023-08-10 — End: 2023-08-10
  Administered 2023-08-10: 40 mg via INTRAVENOUS
  Filled 2023-08-10: qty 10

## 2023-08-10 MED ORDER — METRONIDAZOLE 500 MG PO TABS
500.0000 mg | ORAL_TABLET | Freq: Two times a day (BID) | ORAL | Status: DC
  Administered 2023-08-10 – 2023-08-14 (×9): 500 mg via ORAL
  Filled 2023-08-10 (×9): qty 1

## 2023-08-10 MED ORDER — KETOROLAC TROMETHAMINE 30 MG/ML IJ SOLN
30.0000 mg | Freq: Four times a day (QID) | INTRAMUSCULAR | Status: DC | PRN
  Administered 2023-08-10 – 2023-08-13 (×2): 30 mg via INTRAVENOUS
  Filled 2023-08-10 (×2): qty 1

## 2023-08-10 MED ORDER — ONDANSETRON HCL 4 MG/2ML IJ SOLN
4.0000 mg | Freq: Four times a day (QID) | INTRAMUSCULAR | Status: DC | PRN
  Administered 2023-08-13: 4 mg via INTRAVENOUS
  Filled 2023-08-10: qty 2

## 2023-08-10 MED ORDER — ALUM & MAG HYDROXIDE-SIMETH 200-200-20 MG/5ML PO SUSP
30.0000 mL | Freq: Once | ORAL | Status: AC
  Administered 2023-08-10: 30 mL via ORAL
  Filled 2023-08-10: qty 30

## 2023-08-10 MED ORDER — HYDROMORPHONE HCL 1 MG/ML IJ SOLN
1.0000 mg | INTRAMUSCULAR | Status: DC | PRN
  Administered 2023-08-10 – 2023-08-13 (×7): 1 mg via INTRAVENOUS
  Filled 2023-08-10 (×7): qty 1

## 2023-08-10 MED ORDER — FENTANYL CITRATE PF 50 MCG/ML IJ SOSY
50.0000 ug | PREFILLED_SYRINGE | Freq: Once | INTRAMUSCULAR | Status: AC
  Administered 2023-08-10: 50 ug via INTRAVENOUS
  Filled 2023-08-10: qty 1

## 2023-08-10 MED ORDER — PANTOPRAZOLE SODIUM 20 MG PO TBEC
20.0000 mg | DELAYED_RELEASE_TABLET | Freq: Every day | ORAL | Status: DC
  Administered 2023-08-11 – 2023-08-14 (×4): 20 mg via ORAL
  Filled 2023-08-10 (×4): qty 1

## 2023-08-10 MED ORDER — SODIUM CHLORIDE 0.9 % IV SOLN
1.0000 g | Freq: Every day | INTRAVENOUS | Status: DC
  Administered 2023-08-10 – 2023-08-14 (×5): 1 g via INTRAVENOUS
  Filled 2023-08-10 (×5): qty 10

## 2023-08-10 NOTE — ED Triage Notes (Signed)
 Pt complains of abdominal pain that started around 8pm. Pt also complains of fever of 100.9 during the night.

## 2023-08-10 NOTE — Plan of Care (Signed)

## 2023-08-10 NOTE — ED Notes (Signed)
 Patient transported to Ultrasound

## 2023-08-10 NOTE — ED Provider Notes (Signed)
 Patient was seen by Dr. Hyacinth Meeker at [facility] emergency room earlier this morning.  Patient had a CT scan that showed free fluid in the pelvis.  He reviewed the case with Dr. Earlene Plater, gynecology.  Plan was for evaluation in the ED.  Patient also had a follow-up ultrasound that did not show any other acute findings other than the known fluid in the pelvic region shown on CT scan.  I spoke with Dr. Earlene Plater and she will see the patient.   Linwood Dibbles, MD 08/10/23 1336

## 2023-08-10 NOTE — ED Triage Notes (Signed)
 Abd pain that started last night. Needed ultrasound, and GYN

## 2023-08-10 NOTE — ED Notes (Signed)
 Pt assisted to bathroom

## 2023-08-10 NOTE — ED Provider Notes (Signed)
 [place] EMERGENCY DEPARTMENT AT [facility] Provider Note   CSN: [id number] Arrival date & time: 08/10/23  0741     History  Chief Complaint  Patient presents with   Abdominal Pain    [name] [name] is a 38 y.o. female.   Abdominal Pain  This patient has a 38 year old female, history of anxiety and depression, history of acid reflux on a PPI, she presents with abdominal pain that started acutely last night like someone was stabbing her in the abdomen, this started at about 8:00 last night about 12 hours ago.  It has been persistent severe and not associated with nausea or vomiting.  She has noticed that she has had a fast heart rate, she has not had any bowel movements or difficulty urinating, no blood in the stool.  She denies a history of stomach ulcers or perforation no prior abdominal surgery.  She has had a prior vaginal spontaneous delivery.  She denies being pregnant at this time.  No pain medications given prior to arrival.  Denies alcohol intake    Home Medications Prior to Admission medications   Medication Sig Start Date End Date Taking? Authorizing Provider  albuterol (PROVENTIL HFA;VENTOLIN HFA) 108 (90 Base) MCG/ACT inhaler Inhale 2 puffs into the lungs every 4 (four) hours as needed for wheezing or shortness of breath. 08/11/17   Samuel Jester, DO  FLUoxetine (PROZAC) 20 MG tablet TAKE 1 AND 1/2 TABLETS BY MOUTH EACH DAY FOR DEPRESSION OR ANXIETY 05/17/20   [provider]  ibuprofen (ADVIL) 200 MG tablet Take 200 mg by mouth as needed.    [provider]  levonorgestrel (MIRENA) 20 MCG/24HR IUD 1 each by Intrauterine route once.    [provider]  methocarbamol (ROBAXIN) 500 MG tablet Take 1 tablet (500 mg total) by mouth every 6 (six) hours as needed for muscle spasms. 01/10/23   Dione Booze, MD  ondansetron (ZOFRAN) 4 MG tablet as needed. 03/08/20   [provider]  oxyCODONE-acetaminophen (PERCOCET/ROXICET) 5-325 MG  tablet SMARTSIG:1 Tablet(s) By Mouth 1 to 3 Times Daily 05/22/20   [provider]  pantoprazole (PROTONIX) 20 MG tablet Take 20 mg by mouth daily.    [provider]  predniSONE (DELTASONE) 50 MG tablet Take 1 tablet (50 mg total) by mouth daily. 01/10/23   Dione Booze, MD      Allergies    Bee venom and Penicillins    Review of Systems   Review of Systems  Gastrointestinal:  Positive for abdominal pain.  All other systems reviewed and are negative.   Physical Exam Updated Vital Signs BP 123/74   Pulse (!) 101   Temp 98.8 F (37.1 C) (Oral)   Resp (!) 24   Ht 1.524 m (5')   Wt 59 kg   LMP 08/10/2023   SpO2 100%   BMI 25.39 kg/m  Physical Exam Vitals and nursing note reviewed.  Constitutional:      General: She is in acute distress.     Appearance: She is well-developed. She is diaphoretic.  HENT:     Head: Normocephalic and atraumatic.     Mouth/Throat:     Pharynx: No oropharyngeal exudate.     Comments: Diffuse very poor dentition Eyes:     General: No scleral icterus.       Right eye: No discharge.        Left eye: No discharge.     Conjunctiva/sclera: Conjunctivae normal.     Pupils: Pupils  are equal, round, and reactive to light.  Neck:     Thyroid: No thyromegaly.     Vascular: No JVD.  Cardiovascular:     Rate and Rhythm: Regular rhythm. Tachycardia present.     Heart sounds: Normal heart sounds. No murmur heard.    No friction rub. No gallop.     Comments: Pulse of around 130 Pulmonary:     Effort: Pulmonary effort is normal. No respiratory distress.     Breath sounds: Normal breath sounds. No wheezing or rales.  Abdominal:     General: There is no distension.     Palpations: There is no mass.     Tenderness: There is abdominal tenderness.     Comments: Diffuse guarding and peritoneal signs, cough pain present, no CVA tenderness, nonfocal exam but diffusely severely tender  Genitourinary:    Comments: Chaperone present for pelvic  exam, the patient has again diffuse abdominal tenderness, no cervical motion tenderness, no purulence or foreign bodies in the vaginal vault, no vaginal bleeding, no abnormal findings on the external or internal exam except for some mild fullness and tenderness in the bilateral adnexa.  No masses palpated Musculoskeletal:        General: No tenderness. Normal range of motion.     Cervical back: Normal range of motion and neck supple.     Right lower leg: No edema.     Left lower leg: No edema.  Lymphadenopathy:     Cervical: No cervical adenopathy.  Skin:    General: Skin is warm.     Findings: No erythema or rash.  Neurological:     General: No focal deficit present.     Mental Status: She is alert.     Coordination: Coordination normal.  Psychiatric:        Behavior: Behavior normal.     ED Results / Procedures / Treatments   Labs (all labs ordered are listed, but only abnormal results are displayed) Labs Reviewed  CBC WITH DIFFERENTIAL/PLATELET - Abnormal; Notable for the following components:      Result Value   Lymphs Abs 0.2 (*)    All other components within normal limits  COMPREHENSIVE METABOLIC PANEL WITH GFR - Abnormal; Notable for the following components:   Potassium 3.0 (*)    Glucose, Bld 132 (*)    All other components within normal limits  URINALYSIS, ROUTINE W REFLEX MICROSCOPIC - Abnormal; Notable for the following components:   Color, Urine AMBER (*)    APPearance CLOUDY (*)    Ketones, ur 5 (*)    Protein, ur 100 (*)    Leukocytes,Ua TRACE (*)    Bacteria, UA RARE (*)    All other components within normal limits  LACTIC ACID, PLASMA  LACTIC ACID, PLASMA  HCG, QUANTITATIVE, PREGNANCY  LIPASE, BLOOD  HIV ANTIBODY (ROUTINE TESTING W REFLEX)  GC/CHLAMYDIA PROBE AMP ([place]) NOT AT [facility]  GC/CHLAMYDIA PROBE AMP ([place]) NOT AT [facility]    EKG EKG Interpretation Date/Time:  Sunday August 10 2023 08:37:54 EDT Ventricular Rate:  114 PR  Interval:  130 QRS Duration:  91 QT Interval:  329 QTC Calculation: 453 R Axis:   70  Text Interpretation: Sinus tachycardia Borderline low voltage, extremity leads Since last tracing rate faster Confirmed by Eber Hong ([id number]) on 08/10/2023 8:46:35 AM  Radiology CT ABDOMEN PELVIS W CONTRAST Result Date: 08/10/2023 CLINICAL DATA:  38 year old female with abdominal pain since 2000 hours. Low-grade fever. EXAM: CT ABDOMEN AND PELVIS WITH  CONTRAST TECHNIQUE: Multidetector CT imaging of the abdomen and pelvis was performed using the standard protocol following bolus administration of intravenous contrast. RADIATION DOSE REDUCTION: This exam was performed according to the departmental dose-optimization program which includes automated exposure control, adjustment of the mA and/or kV according to patient size and/or use of iterative reconstruction technique. CONTRAST:  OMNIPAQUE IOHEXOL 300 MG/ML  SOLN COMPARISON:  Portable chest 0804 hours today. FINDINGS: Lower chest: Pectus excavatum.  Otherwise negative. Hepatobiliary: Gallbladder appears distended but otherwise within normal limits. There is a small volume of free fluid along the inferior liver. Liver enhancement within normal limits. No bile duct enlargement. Pancreas: Negative. Spleen: Small volume free fluid along the undersurface of the spleen, more simple fluid density there. Normal spleen otherwise. Adrenals/Urinary Tract: Normal adrenal glands. Nonobstructed kidneys. Renal enhancement appears symmetric and within normal limits. There is a punctate right renal midpole calculus identified. Nondilated ureters. Diminutive bladder. Stomach/Bowel: Redundant but mostly decompressed large bowel in the pelvis. Upstream redundant large bowel and retained stool. Small volume fluid in the left pericolic gutter. Small volume fluid in the right abdomen and gutter. Both of these collections measure complex fluid. Flocculated material in the terminal ileum  with the ileocecal valve visible on coronal image 34. Evidence that the appendix emerges from the tip of the cecum on coronal image 36 and tracks into the right hemipelvis overlying the right external iliac vascular bundle on coronal image 41. Appendix there appears nondilated and contains gas. Flocculated material in the terminal ileum but no abnormally dilated small bowel loops. Abundant complex density free fluid in the anterior lower peritoneal cavity, mid colonic or distal small bowel mesentery coronal image 29. No pneumoperitoneum. Retained food in the stomach. Decompressed duodenum. Vascular/Lymphatic: Major arterial structures in the abdomen and pelvis, portal venous system, central venous structures appear patent and within normal limits. No lymphadenopathy identified. Reproductive: IUD with no adverse features. Otherwise within normal limits. Other: Organized, rim enhancing complex fluid collection in the central pelvis series 2, image 68. Musculoskeletal: Incidental pectus excavatum. Mild levoconvex scoliosis. No acute osseous abnormality identified. IMPRESSION: 1. Abnormal abdomen and pelvis with a moderate volume of complex free fluid in the lower abdomen and the pelvis, with some associated peritoneal thickening, enhancement. Appendix appears to remain normal. No discrete bowel inflammation. No pneumoperitoneum. Redundant large bowel with retained stool but no evidence of bowel obstruction. IUD with no adverse features. Top differential considerations are complicated pelvic inflammatory disease, complicated rupture of hemorrhagic ovarian cyst, or other nonspecific peritonitis. 2. Punctate nonobstructing right renal calculus. Electronically Signed   By: Odessa Fleming M.D.   On: 08/10/2023 10:42   DG Chest Port 1 View Result Date: 08/10/2023 CLINICAL DATA:  38 year old female with abdominal pain since 0800 hours, low-grade fever. Query pneumoperitoneum. EXAM: PORTABLE CHEST 1 VIEW COMPARISON:  Chest  radiographs 08/21/2017. FINDINGS: Portable AP semi upright view at 0804 hours. Lower lung volumes. Mediastinal contours remain normal. Visualized tracheal air column is within normal limits. No pneumothorax. No evidence of pneumoperitoneum, stomach gas below the left hemidiaphragm. Allowing for portable technique the lungs are clear. Nonobstructed visible bowel gas pattern. No acute osseous abnormality identified. IMPRESSION: Lower lung volumes with No acute cardiopulmonary abnormality. No evidence of pneumoperitoneum. Electronically Signed   By: Odessa Fleming M.D.   On: 08/10/2023 08:11    Procedures Procedures    Medications Ordered in ED Medications  HYDROmorphone (DILAUDID) injection 1 mg (has no administration in time range)  pantoprazole (PROTONIX) injection 40 mg (  40 mg Intravenous Given 08/10/23 0820)  HYDROmorphone (DILAUDID) injection 1 mg (1 mg Intravenous Given 08/10/23 0819)  sodium chloride 0.9 % bolus 1,000 mL (1,000 mLs Intravenous New Bag/Given 08/10/23 0825)  potassium chloride 10 mEq in 100 mL IVPB (10 mEq Intravenous New Bag/Given 08/10/23 0932)  alum & mag hydroxide-simeth (MAALOX/MYLANTA) 200-200-20 MG/5ML suspension 30 mL (30 mLs Oral Given 08/10/23 0937)  iohexol (OMNIPAQUE) 300 MG/ML solution 100 mL (100 mLs Intravenous Contrast Given 08/10/23 1004)    ED Course/ Medical Decision Making/ A&P                                 Medical Decision Making Amount and/or Complexity of Data Reviewed Labs: ordered. Radiology: ordered.  Risk OTC drugs. Prescription drug management.    This patient presents to the ED for concern of abdominal pain, this involves an extensive number of treatment options, and is a complaint that carries with it a high risk of complications and morbidity.  The differential diagnosis includes peritoneal causes such as perforation, cholecystitis, pancreatitis, kidney stone, other intestinal maladies, ruptured ectopic   Co morbidities that complicate the patient  evaluation  None   Additional history obtained:  Additional history obtained from medical record External records from outside source obtained and reviewed including prior ED visits, office visits for chest pain with cardiology, no recent admissions to the hospital   Lab Tests:  I Ordered, and personally interpreted labs.  The pertinent results include: CBC metabolic panel unremarkable   Imaging Studies ordered:  I ordered imaging studies including CT  I independently visualized and interpreted imaging which showed fluid in the pelvis, no other acute findings I agree with the radiologist interpretation   Cardiac Monitoring: / EKG:  The patient was maintained on a cardiac monitor.  I personally viewed and interpreted the cardiac monitored which showed an underlying rhythm of: sinus tachycardia   Consultations Obtained:  I requested consultation with the oncologist Dr. Earlene Plater,  and discussed lab and imaging findings as well as pertinent plan - they recommend: Transfer ER to ER for pelvic ultrasound and likely admission to gynecology dW Dr. Rhae Hammock in the ED who has accepted ED to ED.   Problem List / ED Course / Critical interventions / Medication management  This patient has nonpregnant abdominal pain, came on quite acutely, could be ruptured cyst, could be ovarian torsion, it is not colicky but more constant suggesting more of the free fluid and a cyst.  I discussed the care with Dr. Earlene Plater, she agrees that the patient will likely need to be admitted but wants to have the ultrasound first and request ER to ER transfer. I ordered medication including hydromorphone for pain Reevaluation of the patient after these medicines showed that the patient temporary improvement I have reviewed the patients home medicines and have made adjustments as needed   Social Determinants of Health:  None   Test / Admission - Considered:  Will likely need to be admitted         Final  Clinical Impression(s) / ED Diagnoses Final diagnoses:  Pelvic pain  Free fluid in pelvis    Rx / DC Orders ED Discharge Orders     None         Eber Hong, MD 08/10/23 [phone]

## 2023-08-10 NOTE — H&P (Signed)
 OB/GYN History and Physical  [name] [name] is a 38 y.o. [phone] presenting for severe abdominal pain starting last night at 8 pm. Started suddenly, rates 8/10 when not moving, 10/10 with moving. Reports it as "gnawing ache".  Never had pain like this before. Denies fever, chills, no issues with eating/drinking. Not constipated.   No significant gyn history, reports normal pap ~5 years ago, no history of abnormal pap, colpo. Has IUD in place x 5 years and has never had an issue with it. Two prior c-sections at term, no other abdominal surgery. No history of STIs.      Past Medical History:  Diagnosis Date   Anxiety    COVID 06/09/2020   GERD (gastroesophageal reflux disease)    Hx of iron deficiency anemia    Stress at home     Past Surgical History:  Procedure Laterality Date   CESAREAN SECTION  2008/2012   x2   COLONOSCOPY WITH ESOPHAGOGASTRODUODENOSCOPY (EGD) N/A 11/04/2012   JSE:GBTDVV mucosa in the terminal ileum Normal colon/RECTAL BLEEDING DUE TO Moderate sized internal hemorrhoids/MILD Non-erosive gastritis   ESOPHAGOGASTRODUODENOSCOPY N/A 12/24/2012   OHY:WVPXTG esophagus/ LIMITED VIEW OF GASTRIC MUCOSA DUE TO RETAINED GASTRIC CONTENTS    OB History  Gravida Para Term Preterm AB Living  2 2 2   2   SAB IAB Ectopic Multiple Live Births      2    # Outcome Date GA Lbr Len/2nd Weight Sex Type Anes PTL Lv  2 Term      CS-Unspec   LIV  1 Term      CS-Unspec   LIV    Social History   Socioeconomic History   Marital status: Divorced    Spouse name: Not on file   Number of children: 2   Years of education: Not on file   Highest education level: Some college, no degree  Occupational History    Comment: home maker  Tobacco Use   Smoking status: Former    Current packs/day: 0.00    Average packs/day: 0.5 packs/day for 3.0 years (1.5 ttl pk-yrs)    Types: Cigarettes    Start date: 05/06/2009    Quit date: 05/06/2012    Years since quitting: 11.2   Smokeless tobacco:  Never   Tobacco comments:    quit Jan 2014   Substance and Sexual Activity   Alcohol use: No   Drug use: No   Sexual activity: Yes    Birth control/protection: Inserts    Comment: Mirena  Other Topics Concern   Not on file  Social History Narrative   47-year-old   66-year-old          Social Drivers of Corporate investment banker Strain: Not on file  Food Insecurity: Not on file  Transportation Needs: Not on file  Physical Activity: Not on file  Stress: Not on file  Social Connections: Not on file    Family History  Adopted: Yes  Problem Relation Age of Onset   Colon cancer Neg Hx     (Not in a hospital admission)   Allergies  Allergen Reactions   Bee Venom Swelling   Penicillins Other (See Comments)    REACTION: Unknown    Review of Systems: Negative except for what is mentioned in HPI.     Physical Exam: BP 102/66   Pulse (!) 125   Temp 98.7 F (37.1 C)   Resp (!) 29   Ht 5' (1.524 m)   Wt 59 kg  LMP 08/10/2023   SpO2 100%   BMI 25.39 kg/m  CONSTITUTIONAL: Well-developed, well-nourished female in mild distress, acutely in pain with movement HENT:  Normocephalic, atraumatic, External right and left ear normal. Oropharynx is clear and moist, poor dentition EYES: Conjunctivae and EOM are normal. Pupils are equal, round, and reactive to light. No scleral icterus.  NECK: Normal range of motion, supple, no masses SKIN: Skin is warm and dry. No rash noted. Not diaphoretic. No erythema. No pallor. NEUROLGIC: Alert and oriented to person, place, and time. Normal reflexes, muscle tone coordination. No cranial nerve deficit noted. PSYCHIATRIC: Normal mood and affect. Normal behavior. Normal judgment and thought content. CARDIOVASCULAR: Normal heart rate noted RESPIRATORY: Effort normal, no problems with respiration noted ABDOMEN: guarding in all quadrants, moderate to severe tenderness in all quadrants, soft in upper quadrants, no pain over bladder with  palpation, non-distended PELVIC: normal appearing external female genitalia, small amount white discharge in vagina, normal appearing cervix without lesions, no IUD strings visible at cervical os BIMANUAL: no right adnexal tenderness, moderate tenderness at cervix, severe tenderness in left fornix MUSCULOSKELETAL: Normal range of motion. No edema and no tenderness   Pertinent Labs/Studies:   Results for orders placed or performed during the hospital encounter of 08/10/23 (from the past 72 hours)  Urinalysis, Routine w reflex microscopic -Urine, Clean Catch     Status: Abnormal   Collection Time: 08/10/23  8:00 AM  Result Value Ref Range   Color, Urine AMBER (A) YELLOW    Comment: BIOCHEMICALS MAY BE AFFECTED BY COLOR   APPearance CLOUDY (A) CLEAR   Specific Gravity, Urine 1.027 1.005 - 1.030   pH 5.0 5.0 - 8.0   Glucose, UA NEGATIVE NEGATIVE mg/dL   Hgb urine dipstick NEGATIVE NEGATIVE   Bilirubin Urine NEGATIVE NEGATIVE   Ketones, ur 5 (A) NEGATIVE mg/dL   Protein, ur 604 (A) NEGATIVE mg/dL   Nitrite NEGATIVE NEGATIVE   Leukocytes,Ua TRACE (A) NEGATIVE   RBC / HPF 0-5 0 - 5 RBC/hpf   WBC, UA 11-20 0 - 5 WBC/hpf   Bacteria, UA RARE (A) NONE SEEN   Squamous Epithelial / HPF 11-20 0 - 5 /HPF   Mucus PRESENT     Comment: Performed at [facility], [address], [place], [place] [address]  Lactic acid, plasma     Status: None   Collection Time: 08/10/23  8:26 AM  Result Value Ref Range   Lactic Acid, Venous 1.9 0.5 - 1.9 mmol/L    Comment: Performed at [facility], [address] [place], [place] [address]  CBC with Differential     Status: Abnormal   Collection Time: 08/10/23  8:26 AM  Result Value Ref Range   WBC 7.7 4.0 - 10.5 K/uL   RBC 4.24 3.87 - 5.11 MIL/uL   Hemoglobin 12.6 12.0 - 15.0 g/dL   HCT 78.2 95.6 - 21.3 %   MCV 90.8 80.0 - 100.0 fL   MCH 29.7 26.0 - 34.0 pg   MCHC 32.7 30.0 - 36.0 g/dL   RDW 08.6 57.8 - 46.9 %   Platelets 286 150 - 400 K/uL   nRBC 0.0 0.0  - 0.2 %   Neutrophils Relative % 92 %   Neutro Abs 7.1 1.7 - 7.7 K/uL   Lymphocytes Relative 3 %   Lymphs Abs 0.2 (L) 0.7 - 4.0 K/uL   Monocytes Relative 5 %   Monocytes Absolute 0.4 0.1 - 1.0 K/uL   Eosinophils Relative 0 %  Eosinophils Absolute 0.0 0.0 - 0.5 K/uL   Basophils Relative 0 %   Basophils Absolute 0.0 0.0 - 0.1 K/uL   Immature Granulocytes 0 %   Abs Immature Granulocytes 0.02 0.00 - 0.07 K/uL    Comment: Performed at [facility], [address], [place], [place] [address]  Comprehensive metabolic panel     Status: Abnormal   Collection Time: 08/10/23  8:26 AM  Result Value Ref Range   Sodium 136 135 - 145 mmol/L   Potassium 3.0 (L) 3.5 - 5.1 mmol/L   Chloride 99 98 - 111 mmol/L   CO2 25 22 - 32 mmol/L   Glucose, Bld 132 (H) 70 - 99 mg/dL    Comment: Glucose reference range applies only to samples taken after fasting for at least 8 hours.   BUN 14 6 - 20 mg/dL   Creatinine, Ser 6.04 0.44 - 1.00 mg/dL   Calcium 9.4 8.9 - 54.0 mg/dL   Total Protein 6.7 6.5 - 8.1 g/dL   Albumin 3.7 3.5 - 5.0 g/dL   AST 19 15 - 41 U/L   ALT 16 0 - 44 U/L   Alkaline Phosphatase 48 38 - 126 U/L   Total Bilirubin 1.1 0.0 - 1.2 mg/dL   GFR, Estimated >98 >11 mL/min    Comment: (NOTE) Calculated using the CKD-EPI Creatinine Equation (2021)    Anion gap 12 5 - 15    Comment: Performed at [facility], [address], [place], [place] [address]  hCG, quantitative, pregnancy     Status: None   Collection Time: 08/10/23  8:26 AM  Result Value Ref Range   hCG, Beta Chain, Quant, S <1 <5 mIU/mL    Comment:          GEST. AGE      CONC.  (mIU/mL)   <=1 WEEK        5 - 50     2 WEEKS       50 - 500     3 WEEKS       100 - 10,000     4 WEEKS     1,000 - 30,000     5 WEEKS     3,500 - 115,000   6-8 WEEKS     12,000 - 270,000    12 WEEKS     15,000 - 220,000        FEMALE AND NON-PREGNANT FEMALE:     LESS THAN 5 mIU/mL Performed at [facility], [address], [place], [place] [address]    Lipase, blood     Status: None   Collection Time: 08/10/23  8:26 AM  Result Value Ref Range   Lipase 23 11 - 51 U/L    Comment: Performed at [facility], [address], [place], [place] [address]  Lactic acid, plasma     Status: None   Collection Time: 08/10/23  9:57 AM  Result Value Ref Range   Lactic Acid, Venous 1.6 0.5 - 1.9 mmol/L    Comment: Performed at [facility], [address], [place], [place] [address]   CT 08/10/23:  Reproductive: IUD with no adverse features. Otherwise within normal limits.   Other: Organized, rim enhancing complex fluid collection in the central pelvis series 2, image 68.       Assessment and Plan :[name] [name] is a 38 y.o. [phone] admitted for presumed pelvic inflammatory disease in the setting of severe abdominal pain and rim enhancing pelvic collection in CT  with severe tenderness in same area. Reviewed that while she does not have a white count or fever, her severe tenderness in the setting of collection on imaging is concerning for presumed infection. Reviewed that cannot determine definitively if this is upper reproductive tract in nature vs intestinal but location is suspicious based on imaging and recommend admission for IV antibiotics. IUD in place per [place] but strings not visible on exam. Gonorrhea/chlamydia collected.   Given complex free fluid, hemorrhagic cyst rupture on differential but less likely, no cysts or ovarian enlargement and torsion low on ddx as well. Will plan to consult hospitalist service/re-image if no improvements in pain with PID regimen.  Presumed PID - am CBC - ceftriaxone, flagyl, doxycline - clear liquids - pain meds prn - repeat CBC in am  GERD - home protonix ordered  Depression - home zolopt/duloxetine ordered  Daily - activity as tolerated    Baldemar Lenis, M.D. Attending Obstetrician & Gynecologist, [facility] for [employer], [facility]

## 2023-08-10 NOTE — Plan of Care (Signed)
  Problem: Activity: Goal: Risk for activity intolerance will decrease Outcome: Progressing   Problem: Nutrition: Goal: Adequate nutrition will be maintained Outcome: Progressing   Problem: Coping: Goal: Level of anxiety will decrease Outcome: Progressing   Problem: Pain Managment: Goal: General experience of comfort will improve and/or be controlled Outcome: Progressing   Problem: Safety: Goal: Ability to remain free from injury will improve Outcome: Progressing

## 2023-08-10 NOTE — ED Notes (Signed)
 Patient transported to CT

## 2023-08-11 DIAGNOSIS — R1084 Generalized abdominal pain: Secondary | ICD-10-CM | POA: Diagnosis present

## 2023-08-11 DIAGNOSIS — E876 Hypokalemia: Secondary | ICD-10-CM | POA: Diagnosis present

## 2023-08-11 DIAGNOSIS — F32A Depression, unspecified: Secondary | ICD-10-CM | POA: Diagnosis present

## 2023-08-11 DIAGNOSIS — K219 Gastro-esophageal reflux disease without esophagitis: Secondary | ICD-10-CM | POA: Diagnosis present

## 2023-08-11 DIAGNOSIS — K659 Peritonitis, unspecified: Secondary | ICD-10-CM | POA: Diagnosis present

## 2023-08-11 DIAGNOSIS — Z8616 Personal history of COVID-19: Secondary | ICD-10-CM | POA: Diagnosis not present

## 2023-08-11 DIAGNOSIS — Z880 Allergy status to penicillin: Secondary | ICD-10-CM | POA: Diagnosis not present

## 2023-08-11 DIAGNOSIS — N730 Acute parametritis and pelvic cellulitis: Secondary | ICD-10-CM | POA: Diagnosis present

## 2023-08-11 DIAGNOSIS — Z975 Presence of (intrauterine) contraceptive device: Secondary | ICD-10-CM | POA: Diagnosis not present

## 2023-08-11 DIAGNOSIS — Z91030 Bee allergy status: Secondary | ICD-10-CM | POA: Diagnosis not present

## 2023-08-11 DIAGNOSIS — Z87891 Personal history of nicotine dependence: Secondary | ICD-10-CM | POA: Diagnosis not present

## 2023-08-11 LAB — CBC
HCT: 31.6 % — ABNORMAL LOW (ref 36.0–46.0)
Hemoglobin: 10.1 g/dL — ABNORMAL LOW (ref 12.0–15.0)
MCH: 29.1 pg (ref 26.0–34.0)
MCHC: 32 g/dL (ref 30.0–36.0)
MCV: 91.1 fL (ref 80.0–100.0)
Platelets: 235 10*3/uL (ref 150–400)
RBC: 3.47 MIL/uL — ABNORMAL LOW (ref 3.87–5.11)
RDW: 13.1 % (ref 11.5–15.5)
WBC: 8.3 10*3/uL (ref 4.0–10.5)
nRBC: 0 % (ref 0.0–0.2)

## 2023-08-11 LAB — COMPREHENSIVE METABOLIC PANEL WITH GFR
ALT: 14 U/L (ref 0–44)
AST: 16 U/L (ref 15–41)
Albumin: 3 g/dL — ABNORMAL LOW (ref 3.5–5.0)
Alkaline Phosphatase: 46 U/L (ref 38–126)
Anion gap: 9 (ref 5–15)
BUN: 12 mg/dL (ref 6–20)
CO2: 22 mmol/L (ref 22–32)
Calcium: 8.4 mg/dL — ABNORMAL LOW (ref 8.9–10.3)
Chloride: 103 mmol/L (ref 98–111)
Creatinine, Ser: 0.76 mg/dL (ref 0.44–1.00)
GFR, Estimated: >60 mL/min (ref >60)
Glucose, Bld: 96 mg/dL (ref 70–99)
Potassium: 3.2 mmol/L — ABNORMAL LOW (ref 3.5–5.1)
Sodium: 134 mmol/L — ABNORMAL LOW (ref 135–145)
Total Bilirubin: 0.6 mg/dL (ref 0.0–1.2)
Total Protein: 5.7 g/dL — ABNORMAL LOW (ref 6.5–8.1)

## 2023-08-11 LAB — GC/CHLAMYDIA PROBE AMP (~~LOC~~) NOT AT ARMC
Chlamydia: NEGATIVE
Comment: NEGATIVE
Comment: NORMAL
Neisseria Gonorrhea: NEGATIVE

## 2023-08-11 NOTE — Progress Notes (Signed)
   08/11/23 0807  Assess: MEWS Score  Temp 98.3 F (36.8 C)  BP (!) 141/85  MAP (mmHg) 100  Pulse Rate (!) 112  Resp 16  Level of Consciousness Alert  SpO2 100 %  O2 Device Room Air  Assess: MEWS Score  MEWS Temp 0  MEWS Systolic 0  MEWS Pulse 2  MEWS RR 0  MEWS LOC 0  MEWS Score 2  MEWS Score Color Yellow  Assess: if the MEWS score is Yellow or Red  Were vital signs accurate and taken at a resting state? Yes  Does the patient meet 2 or more of the SIRS criteria? No  MEWS guidelines implemented  Yes, yellow  Treat  MEWS Interventions Considered administering scheduled or prn medications/treatments as ordered  Take Vital Signs  Increase Vital Sign Frequency  Yellow: Q2hr x1, continue Q4hrs until patient remains green for 12hrs  Escalate  MEWS: Escalate Yellow: Discuss with charge nurse and consider notifying provider and/or RRT  Notify: Charge Nurse/RN  Name of Charge Nurse/RN Notified Quarry manager  Provider Notification  Provider Name/Title Leroy Libman MD  Date Provider Notified 08/11/23  Time Provider Notified [phone]  Method of Notification Page  Notification Reason Change in status  Assess: SIRS CRITERIA  SIRS Temperature  0  SIRS Respirations  0  SIRS Pulse 1  SIRS WBC 0  SIRS Score Sum  1

## 2023-08-11 NOTE — Plan of Care (Signed)
  Problem: Clinical Measurements: Goal: Diagnostic test results will improve Outcome: Progressing   Problem: Activity: Goal: Risk for activity intolerance will decrease Outcome: Progressing   Problem: Nutrition: Goal: Adequate nutrition will be maintained Outcome: Progressing   Problem: Coping: Goal: Level of anxiety will decrease Outcome: Progressing   Problem: Pain Managment: Goal: General experience of comfort will improve and/or be controlled Outcome: Progressing

## 2023-08-11 NOTE — Plan of Care (Signed)

## 2023-08-11 NOTE — Progress Notes (Signed)
 Gynecology Progress Note  Admission Date: 08/10/2023 Current Date: 08/11/2023 9:58 AM  [name] [name] is a 38 y.o. [id number] HD#2 admitted for abdominal pain, possible PID with fluid collection in the pelvis.   History complicated by: Patient Active Problem List   Diagnosis Date Noted   PID (acute pelvic inflammatory disease) 08/10/2023   Constipation 10/29/2012   Heme positive stool 10/29/2012   Abdominal pain, other specified site 10/29/2012    ROS and patient/family/surgical history, located on admission H&P note dated 08/10/2023, have been reviewed, and there are no changes except as noted below Yesterday/Overnight Events:  Non significant  Subjective:  Pt seen.  She still notes abdominal/pelvic pain, especially with movement.  Pt also notes she notes some uptick in pain when she eats as well as when she uses the rest room.  She denies fever/chills.  Mild nausea noted.  Per pt no real improvement in pain at this time.  Objective:   Vitals:   08/10/23 2005 08/11/23 0451 08/11/23 0807 08/11/23 0929  BP: 129/74 123/79 (!) 141/85 (!) 140/89  Pulse:   (!) 112 (!) 115  Resp: 17 17 16 17   Temp: 98.7 F (37.1 C) 98.6 F (37 C) 98.3 F (36.8 C)   TempSrc: Oral Oral Oral   SpO2: 100% 100% 100% 100%  Weight:      Height:        Temp:  [98 F (36.7 C)-98.7 F (37.1 C)] 98.3 F (36.8 C) (04/07 0807) Pulse Rate:  [104-129] 115 (04/07 0929) Resp:  [16-29] 17 (04/07 0929) BP: (102-149)/(66-96) 140/89 (04/07 0929) SpO2:  [98 %-100 %] 100 % (04/07 0929) Weight:  [59 kg] 59 kg (04/06 1544) I/O last 3 completed shifts: In: 1796.5 [P.O.:120; I.V.:476; IV Piggyback:1200.5] Out: -  No intake/output data recorded.  Intake/Output Summary (Last 24 hours) at 08/11/2023 0958 Last data filed at 08/11/2023 0300 Gross per 24 hour  Intake 1796.54 ml  Output --  Net 1796.54 ml     Current Vital Signs 24h Vital Sign Ranges  T 98.3 F (36.8 C) Temp  Avg: 98.5 F (36.9 C)  Min: 98 F (36.7 C)   Max: 98.7 F (37.1 C)  BP (!) 140/89 BP  Min: 102/66  Max: 149/96  HR (!) 115 Pulse  Avg: 116.1  Min: 104  Max: 129  RR 17 Resp  Avg: 21.4  Min: 16  Max: 29  SaO2 100 % Room Air SpO2  Avg: 99.6 %  Min: 98 %  Max: 100 %       24 Hour I/O Current Shift I/O  Time Ins Outs 04/06 0701 - 04/07 0700 In: 1796.5 [P.O.:120; I.V.:476] Out: -  No intake/output data recorded.   Patient Vitals for the past 12 hrs:  BP Temp Temp src Pulse Resp SpO2  08/11/23 0929 (!) 140/89 -- -- (!) 115 17 100 %  08/11/23 0807 (!) 141/85 98.3 F (36.8 C) Oral (!) 112 16 100 %  08/11/23 0451 123/79 98.6 F (37 C) Oral -- 17 100 %     Patient Vitals for the past 24 hrs:  BP Temp Temp src Pulse Resp SpO2 Height Weight  08/11/23 0929 (!) 140/89 -- -- (!) 115 17 100 % -- --  08/11/23 0807 (!) 141/85 98.3 F (36.8 C) Oral (!) 112 16 100 % -- --  08/11/23 0451 123/79 98.6 F (37 C) Oral -- 17 100 % -- --  08/10/23 2005 129/74 98.7 F (37.1 C) Oral -- 17 100 % -- --  08/10/23 1607 128/86 98 F (36.7 C) Oral (!) 104 17 100 % -- --  08/10/23 1544 -- -- -- -- -- -- 5' (1.524 m) 59 kg  08/10/23 1415 102/66 -- -- (!) 125 (!) 29 100 % -- --  08/10/23 1326 (!) 149/96 98.7 F (37.1 C) -- (!) 129 (!) 26 100 % -- --  08/10/23 1200 (!) 130/91 -- -- (!) 116 (!) 25 99 % -- --  08/10/23 1145 (!) 136/91 -- -- (!) 116 (!) 24 99 % -- --  08/10/23 1130 133/86 -- -- (!) 112 (!) 25 99 % -- --  08/10/23 1115 (!) 133/94 -- -- (!) 112 (!) 25 98 % -- --  08/10/23 1100 134/76 -- -- (!) 120 19 100 % -- --    Physical exam: General appearance: alert, cooperative, appears older than stated age, and mild distress Abdomen:  soft, nondistended, exquisite pain in bilateral upper quadrants.  Minimal pain in the lower abdomen. GU: No gross VB Lungs: clear to auscultation bilaterally Heart: regular rate and rhythm Extremities: no lower extremity edema Skin: WNL Psych: appropriate Neurologic: Grossly normal  Medications Current  Facility-Administered Medications  Medication Dose Route Frequency Provider Last Rate Last Admin   0.9 %  sodium chloride infusion   Intravenous Continuous Conan Bowens, MD 40 mL/hr at 08/10/23 1618 Infusion Verify at 08/10/23 1618   cefTRIAXone (ROCEPHIN) 1 g in sodium chloride 0.9 % 100 mL IVPB  1 g Intravenous Daily Conan Bowens, MD   Stopped at 08/10/23 1619   doxycycline (VIBRA-TABS) tablet 100 mg  100 mg Oral Q12H Conan Bowens, MD   100 mg at 08/10/23 2015   FLUoxetine (PROZAC) capsule 30 mg  30 mg Oral Daily Conan Bowens, MD       HYDROmorphone (DILAUDID) injection 1 mg  1 mg Intravenous Q3H PRN Conan Bowens, MD   1 mg at 08/11/23 0540   ketorolac (TORADOL) 30 MG/ML injection 30 mg  30 mg Intravenous Q6H PRN Conan Bowens, MD   30 mg at 08/10/23 1503   metroNIDAZOLE (FLAGYL) tablet 500 mg  500 mg Oral Q12H Conan Bowens, MD   500 mg at 08/10/23 2015   ondansetron (ZOFRAN) tablet 4 mg  4 mg Oral Q6H PRN Conan Bowens, MD       Or   ondansetron [facility]) injection 4 mg  4 mg Intravenous Q6H PRN Conan Bowens, MD       Oral care mouth rinse  15 mL Mouth Rinse PRN Conan Bowens, MD       oxyCODONE (Oxy IR/ROXICODONE) immediate release tablet 10 mg  10 mg Oral Q4H PRN Conan Bowens, MD   10 mg at 08/11/23 0816   pantoprazole (PROTONIX) EC tablet 20 mg  20 mg Oral Daily Conan Bowens, MD       polyethylene glycol (MIRALAX / GLYCOLAX) packet 17 g  17 g Oral Daily PRN Conan Bowens, MD       potassium chloride (KLOR-CON) packet 20 mEq  20 mEq Oral BID Conan Bowens, MD   20 mEq at 08/10/23 2015   prenatal multivitamin tablet 1 tablet  1 tablet Oral Q1200 Conan Bowens, MD          Labs  Recent Labs  Lab 08/10/23 [id number] 08/11/23 0743  WBC 7.7 8.3  HGB 12.6 10.1*  HCT 38.5 31.6*  PLT 286 235    Recent Labs  Lab 08/10/23  6045 08/10/23 1557 08/11/23 0743  NA 136 137 134*  K 3.0* 3.4* 3.2*  CL 99 105 103  CO2 25 23 22   BUN 14 12 12   CREATININE 0.89 0.88 0.76   CALCIUM 9.4 8.6* 8.4*  PROT 6.7 6.2* 5.7*  BILITOT 1.1 0.7 0.6  ALKPHOS 48 43 46  ALT 16 14 14   AST 19 18 16   GLUCOSE 132* 109* 96    Radiology No new studies  Assessment & Plan:  PID:  unsure of the source.  GI versus gyn.            Continue antibiotics          GC/C pending          Will discuss with interventional radiology regarding possible drain if no   real improvement in 24-48 hours.  Hypokalemia:  patient is already on oral replacement                         Recheck CMP in AM along with serum magnesium *Pain: pt has oral oxycodone as well as toradol and dilaudid available *FEN/GI: pt declined advancing diet for today  *Dispo: continue inpatient hospital treatment with IV antibiotics  Code Status: Full Code  Total time taking care of the patient was 20 minutes, with greater than 50% of the time spent in face to face interaction with the patient.  Mariel Aloe, MD Attending Center for [facility])

## 2023-08-11 NOTE — TOC Initial Note (Addendum)
 Transition of Care (TOC) - Initial/Assessment Note   Spoke to patient and significant other Graylin Shiver at bedside. Patient from home with Chrissie Noa .   Patient does not currently have a PCP, hers retired. NCM explained they can call number on insurance card or visit insurance 's website and be provided a complete list of providers in network with her insurance. Both stated they have been  looking into getting a PCP and will schedule an appointment   Patient currently on IV ABX, per note if no improvement MD may consult IR for drain. If patient needs drain , bedside nurse will provide drain education prior to discharge.  Patient Details  Name: JENELLA CRAIGIE MRN: [id number] Date of Birth: 1985/08/14  Transition of Care [facility]) CM/SW Contact:    Kingsley Plan, RN Phone Number: 08/11/2023, 2:30 PM  Clinical Narrative:                   Expected Discharge Plan: Home/Self Care Barriers to Discharge: Continued Medical Work up   Patient Goals and CMS Choice Patient states their goals for this hospitalization and ongoing recovery are:: to return to home   Choice offered to / list presented to : NA      Expected Discharge Plan and Services   Discharge Planning Services: CM Consult Post Acute Care Choice: NA Living arrangements for the past 2 months: Single Family Home                 DME Arranged: N/A DME Agency: NA       HH Arranged: NA HH Agency: NA        Prior Living Arrangements/Services Living arrangements for the past 2 months: Single Family Home Lives with:: Significant Other Patient language and need for interpreter reviewed:: Yes Do you feel safe going back to the place where you live?: Yes      Need for Family Participation in Patient Care: Yes (Comment) Care giver support system in place?: Yes (comment)   Criminal Activity/Legal Involvement Pertinent to Current Situation/Hospitalization: No - Comment as needed  Activities of Daily Living   ADL  Screening (condition at time of admission) Independently performs ADLs?: Yes (appropriate for developmental age) Is the patient deaf or have difficulty hearing?: No Does the patient have difficulty seeing, even when wearing glasses/contacts?: No Does the patient have difficulty concentrating, remembering, or making decisions?: No  Permission Sought/Granted   Permission granted to share information with : Yes, Verbal Permission Granted  Share Information with NAME: significant other Graylin Shiver           Emotional Assessment Appearance:: Appears stated age Attitude/Demeanor/Rapport: Engaged Affect (typically observed): Appropriate Orientation: : Oriented to Self, Oriented to Place, Oriented to  Time, Oriented to Situation Alcohol / Substance Use: Not Applicable Psych Involvement: No (comment)  Admission diagnosis:  PID (acute pelvic inflammatory disease) [N73.0] Pelvic pain [R10.2] Free fluid in pelvis [R18.8] Patient Active Problem List   Diagnosis Date Noted   PID (acute pelvic inflammatory disease) 08/10/2023   Constipation 10/29/2012   Heme positive stool 10/29/2012   Abdominal pain, other specified site 10/29/2012   PCP:  Patient, No Pcp Per Pharmacy:   [id number] - [place], [place] - [address]  [place] [address] Phone: [phone] Fax: [phone]  [facility] [address] [place] [address] Phone: [phone] Fax: [phone]  Social Drivers of Health (SDOH) Social History: SDOH Screenings   Food Insecurity: No Food Insecurity (08/10/2023)  Housing: Low Risk  (08/10/2023)  Transportation Needs: No Transportation Needs (08/10/2023)  Utilities: Not At Risk (08/10/2023)  Tobacco Use: Medium Risk (08/10/2023)   SDOH Interventions:     Readmission Risk Interventions     No data to display

## 2023-08-12 LAB — COMPREHENSIVE METABOLIC PANEL WITH GFR
ALT: 13 U/L (ref 0–44)
AST: 18 U/L (ref 15–41)
Albumin: 2.9 g/dL — ABNORMAL LOW (ref 3.5–5.0)
Alkaline Phosphatase: 55 U/L (ref 38–126)
Anion gap: 12 (ref 5–15)
BUN: 7 mg/dL (ref 6–20)
CO2: 23 mmol/L (ref 22–32)
Calcium: 8.8 mg/dL — ABNORMAL LOW (ref 8.9–10.3)
Chloride: 99 mmol/L (ref 98–111)
Creatinine, Ser: 0.81 mg/dL (ref 0.44–1.00)
GFR, Estimated: >60 mL/min (ref >60)
Glucose, Bld: 102 mg/dL — ABNORMAL HIGH (ref 70–99)
Potassium: 3.5 mmol/L (ref 3.5–5.1)
Sodium: 134 mmol/L — ABNORMAL LOW (ref 135–145)
Total Bilirubin: 0.7 mg/dL (ref 0.0–1.2)
Total Protein: 5.9 g/dL — ABNORMAL LOW (ref 6.5–8.1)

## 2023-08-12 LAB — CBC
HCT: 31.4 % — ABNORMAL LOW (ref 36.0–46.0)
Hemoglobin: 10.4 g/dL — ABNORMAL LOW (ref 12.0–15.0)
MCH: 29.5 pg (ref 26.0–34.0)
MCHC: 33.1 g/dL (ref 30.0–36.0)
MCV: 89.2 fL (ref 80.0–100.0)
Platelets: 236 10*3/uL (ref 150–400)
RBC: 3.52 MIL/uL — ABNORMAL LOW (ref 3.87–5.11)
RDW: 13 % (ref 11.5–15.5)
WBC: 7.8 10*3/uL (ref 4.0–10.5)
nRBC: 0 % (ref 0.0–0.2)

## 2023-08-12 LAB — MAGNESIUM: Magnesium: 1.7 mg/dL (ref 1.7–2.4)

## 2023-08-12 MED ORDER — ALUM & MAG HYDROXIDE-SIMETH 200-200-20 MG/5ML PO SUSP
30.0000 mL | Freq: Four times a day (QID) | ORAL | Status: DC | PRN
  Administered 2023-08-12 – 2023-08-13 (×2): 30 mL via ORAL
  Filled 2023-08-12 (×2): qty 30

## 2023-08-12 MED ORDER — OXYCODONE HCL 5 MG PO TABS
5.0000 mg | ORAL_TABLET | ORAL | Status: DC | PRN
  Administered 2023-08-12: 5 mg via ORAL
  Administered 2023-08-12: 10 mg via ORAL
  Administered 2023-08-12: 5 mg via ORAL
  Administered 2023-08-12 – 2023-08-13 (×4): 10 mg via ORAL
  Filled 2023-08-12 (×4): qty 2
  Filled 2023-08-12: qty 1
  Filled 2023-08-12: qty 2
  Filled 2023-08-12: qty 1

## 2023-08-12 MED ORDER — ACETAMINOPHEN 500 MG PO TABS
1000.0000 mg | ORAL_TABLET | Freq: Four times a day (QID) | ORAL | Status: DC
  Administered 2023-08-12 – 2023-08-14 (×8): 1000 mg via ORAL
  Filled 2023-08-12 (×9): qty 2

## 2023-08-12 MED ORDER — IBUPROFEN 600 MG PO TABS
600.0000 mg | ORAL_TABLET | Freq: Four times a day (QID) | ORAL | Status: DC
  Administered 2023-08-12 – 2023-08-14 (×7): 600 mg via ORAL
  Filled 2023-08-12 (×7): qty 1

## 2023-08-12 NOTE — Plan of Care (Signed)

## 2023-08-12 NOTE — Plan of Care (Signed)

## 2023-08-12 NOTE — Progress Notes (Addendum)
 Gynecology Progress Note  Admission Date: 08/10/2023 Current Date: 08/12/2023 2:47 PM  [name] [name] is a 38 y.o. [id number] HD#3 admitted for abdominal pain, possible PID with fluid collection in the pelvis.   History complicated by: Patient Active Problem List   Diagnosis Date Noted   PID (acute pelvic inflammatory disease) 08/10/2023   Constipation 10/29/2012   ROS and patient/family/surgical history, located on admission H&P note dated 08/10/2023, have been reviewed, and there are no changes except as noted below Yesterday/Overnight Events:  None  Subjective:  Doing OK. Would like to advance diet. Feels like pain is getting better. OOB to bathroom, but not ambulating. Denies nausea/vomiting, chills, vaginal discharge, or vaginal bleeding.   Objective:   Vitals:   08/12/23 0408 08/12/23 0911 08/12/23 1240 08/12/23 1414  BP: (!) 134/90 (!) 140/89 (!) 136/98 (!) 151/105  Pulse: (!) 111 96 92   Resp: 17 18 16    Temp: 98.1 F (36.7 C) 98 F (36.7 C) 98 F (36.7 C)   TempSrc: Oral Oral Oral   SpO2: 97% 99% 95%   Weight:      Height:        Temp:  [98 F (36.7 C)-98.7 F (37.1 C)] 98 F (36.7 C) (04/08 1240) Pulse Rate:  [92-124] 92 (04/08 1240) Resp:  [15-18] 16 (04/08 1240) BP: (121-156)/(73-105) 151/105 (04/08 1414) SpO2:  [95 %-100 %] 95 % (04/08 1240) I/O last 3 completed shifts: In: 431.3 [I.V.:428; IV Piggyback:3.3] Out: -  No intake/output data recorded. No intake or output data in the 24 hours ending 08/12/23 1447    Current Vital Signs 24h Vital Sign Ranges  T 98 F (36.7 C) Temp  Avg: 98.3 F (36.8 C)  Min: 98 F (36.7 C)  Max: 98.7 F (37.1 C)  BP (!) 151/105 BP  Min: 121/73  Max: 156/98  HR 92 Pulse  Avg: 110.6  Min: 92  Max: 124  RR 16 Resp  Avg: 17  Min: 15  Max: 18  SaO2 95 % Room Air SpO2  Avg: 97.4 %  Min: 95 %  Max: 100 %       24 Hour I/O Current Shift I/O  Time Ins Outs No intake/output data recorded. No intake/output data recorded.    Patient Vitals for the past 12 hrs:  BP Temp Temp src Pulse Resp SpO2  08/12/23 1414 (!) 151/105 -- -- -- -- --  08/12/23 1240 (!) 136/98 98 F (36.7 C) Oral 92 16 95 %  08/12/23 0911 (!) 140/89 98 F (36.7 C) Oral 96 18 99 %  08/12/23 0408 (!) 134/90 98.1 F (36.7 C) Oral (!) 111 17 97 %     Patient Vitals for the past 24 hrs:  BP Temp Temp src Pulse Resp SpO2  08/12/23 1414 (!) 151/105 -- -- -- -- --  08/12/23 1240 (!) 136/98 98 F (36.7 C) Oral 92 16 95 %  08/12/23 0911 (!) 140/89 98 F (36.7 C) Oral 96 18 99 %  08/12/23 0408 (!) 134/90 98.1 F (36.7 C) Oral (!) 111 17 97 %  08/12/23 0120 (!) 156/98 98.7 F (37.1 C) Oral (!) 119 18 99 %  08/12/23 0117 (!) 156/98 98.7 F (37.1 C) -- (!) 119 18 99 %  08/11/23 2010 121/73 98.2 F (36.8 C) -- (!) 112 17 95 %  08/11/23 2010 121/73 98.2 F (36.8 C) Oral (!) 112 17 95 %  08/11/23 1700 -- 98.4 F (36.9 C) Axillary -- -- --  08/11/23 1656 (!) 145/86 98.4 F (36.9 C) Axillary (!) 124 15 100 %    Physical exam: General appearance: resting comfortably in bed, alert & oriented Abdomen: soft, moderate RLQ tenderness with guarding, no rebound. Mild tenderness in b/l upper quadrants Lungs: normal effort Heart: mildly tachycardia noted Extremities: no lower extremity edema  Medications Current Facility-Administered Medications  Medication Dose Route Frequency Provider Last Rate Last Admin   acetaminophen (TYLENOL) tablet 1,000 mg  1,000 mg Oral Q6H Harvie Bridge C, MD   1,000 mg at 08/12/23 0934   alum & mag hydroxide-simeth (MAALOX/MYLANTA) 200-200-20 MG/5ML suspension 30 mL  30 mL Oral Q6H PRN Lennart Pall, MD       cefTRIAXone (ROCEPHIN) 1 g in sodium chloride 0.9 % 100 mL IVPB  1 g Intravenous Daily Conan Bowens, MD 200 mL/hr at 08/12/23 0851 1 g at 08/12/23 0851   doxycycline (VIBRA-TABS) tablet 100 mg  100 mg Oral Q12H Conan Bowens, MD   100 mg at 08/12/23 0846   FLUoxetine (PROZAC) capsule 30 mg  30 mg  Oral Daily Conan Bowens, MD   30 mg at 08/12/23 0847   HYDROmorphone (DILAUDID) injection 1 mg  1 mg Intravenous Q3H PRN Conan Bowens, MD   1 mg at 08/12/23 0847   ibuprofen (ADVIL) tablet 600 mg  600 mg Oral Q6H Harvie Bridge C, MD   600 mg at 08/12/23 1125   ketorolac (TORADOL) 30 MG/ML injection 30 mg  30 mg Intravenous Q6H PRN Conan Bowens, MD   30 mg at 08/10/23 1503   metroNIDAZOLE (FLAGYL) tablet 500 mg  500 mg Oral Q12H Conan Bowens, MD   500 mg at 08/12/23 0846   ondansetron (ZOFRAN) tablet 4 mg  4 mg Oral Q6H PRN Conan Bowens, MD       Or   ondansetron [facility]) injection 4 mg  4 mg Intravenous Q6H PRN Conan Bowens, MD       Oral care mouth rinse  15 mL Mouth Rinse PRN Conan Bowens, MD       oxyCODONE (Oxy IR/ROXICODONE) immediate release tablet 5-10 mg  5-10 mg Oral Q4H PRN Harvie Bridge C, MD   5 mg at 08/12/23 1414   pantoprazole (PROTONIX) EC tablet 20 mg  20 mg Oral Daily Conan Bowens, MD   20 mg at 08/12/23 0846   polyethylene glycol (MIRALAX / GLYCOLAX) packet 17 g  17 g Oral Daily PRN Conan Bowens, MD       prenatal multivitamin tablet 1 tablet  1 tablet Oral Q1200 Conan Bowens, MD   1 tablet at 08/12/23 1125    Labs  Recent Labs  Lab 08/10/23 0826 08/11/23 0743 08/12/23 0654  WBC 7.7 8.3 7.8  HGB 12.6 10.1* 10.4*  HCT 38.5 31.6* 31.4*  PLT 286 235 236    Recent Labs  Lab 08/10/23 1557 08/11/23 0743 08/12/23 0654  NA 137 134* 134*  K 3.4* 3.2* 3.5  CL 105 103 99  CO2 23 22 23   BUN 12 12 7   CREATININE 0.88 0.76 0.81  CALCIUM 8.6* 8.4* 8.8*  PROT 6.2* 5.7* 5.9*  BILITOT 0.7 0.6 0.7  ALKPHOS 43 46 55  ALT 14 14 13   AST 18 16 18   GLUCOSE 109* 96 102*   Radiology No new studies  Assessment & Plan:  PID - Clinically improving, remains afebrile without leukocytosis - Continue CTX/doxy/flagyl, anticipate transition to PO abx tomorrow -  GC/CT negative - Adding scheduled tylenol/ibuprofen in an attempt to get pain under control  with PO meds only - Encourage ambulation, regular diet - If clinical course does not continue to improve, can re scan   2. Routine - continue daily labs and replete lytes prn - advance to regular diet - encouraged ambulation  *Dispo: continue inpatient hospital treatment with IV antibiotics, potential discharge home tomorrow if continuing clinical improvement and able to transition to all PO medications  Code Status: Full Code VTE PPX: SCDs, will add lovenox tomorrow AM if continues to improve  Harvie Bridge, MD Obstetrician & Gynecologist, [facility] for [employer], [facility]

## 2023-08-13 ENCOUNTER — Inpatient Hospital Stay (HOSPITAL_COMMUNITY)

## 2023-08-13 MED ORDER — METOCLOPRAMIDE HCL 5 MG/ML IJ SOLN
10.0000 mg | Freq: Four times a day (QID) | INTRAMUSCULAR | Status: DC | PRN
Start: 2023-08-13 — End: 2023-08-14

## 2023-08-13 MED ORDER — METOCLOPRAMIDE HCL 5 MG PO TABS: 10.0000 mg | ORAL_TABLET | Freq: Four times a day (QID) | ORAL | Status: DC | PRN

## 2023-08-13 MED ORDER — IOHEXOL 350 MG/ML SOLN
75.0000 mL | Freq: Once | INTRAVENOUS | Status: AC | PRN
  Administered 2023-08-13: 75 mL via INTRAVENOUS

## 2023-08-13 MED ORDER — IOHEXOL 9 MG/ML PO SOLN
500.0000 mL | ORAL | Status: AC
  Administered 2023-08-13 (×2): 500 mL via ORAL

## 2023-08-13 MED ORDER — IOHEXOL 350 MG/ML SOLN
50.0000 mL | Freq: Once | INTRAVENOUS | Status: AC | PRN
  Administered 2023-08-13: 50 mL

## 2023-08-13 MED ORDER — HYDROMORPHONE HCL 1 MG/ML IJ SOLN: 0.2000 mg | INTRAMUSCULAR | Status: DC | PRN

## 2023-08-13 NOTE — Progress Notes (Signed)
 Came to bedside and talked to patient about her experience with the CT scan and administration of the rectal contrast.  Apologized to her about the route of administration for this contrast which was unexpected and traumatizing to her; we did not know that this was the route of administration.  Patient is feeling better now, understands the need for getting contrast study but just wished she knew about the route of administration. I again apologized to her for her experience, and we will definitely keep this in mind in ordering any other studies that require rectal contrast.  The read of the CT scan is still pending, patient was informed she will be notified once this is read and any follow up plans will be made likely tomorrow morning. For now, will continue current care.  This plan was also communicated to the patient's nurse.   Jaynie Collins, MD

## 2023-08-13 NOTE — Progress Notes (Signed)
 CT ABDOMEN PELVIS W CONTRAST Result Date: 08/13/2023 CLINICAL DATA:  Abdominal pain. Pelvic peritonitis on the comparison CT exam EXAM: CT ABDOMEN AND PELVIS WITH CONTRAST TECHNIQUE: Multidetector CT imaging of the abdomen and pelvis was performed using the standard protocol following bolus administration of intravenous contrast. RADIATION DOSE REDUCTION: This exam was performed according to the departmental dose-optimization program which includes automated exposure control, adjustment of the mA and/or kV according to patient size and/or use of iterative reconstruction technique. CONTRAST:  50mL OMNIPAQUE IOHEXOL 350 MG/ML SOLN, 75mL OMNIPAQUE IOHEXOL 350 MG/ML SOLN COMPARISON:  CT 08/10/2023 FINDINGS: Lower chest: Mild basilar atelectasis. Trace pleural fluid at the RIGHT lung base. Hepatobiliary: Mild extrahepatic biliary duct dilatation with the common bile duct measuring 8 mm. Gallbladder mildly distended measuring 4.4 cm. No radiodense gallstones. Is Pancreas: Pancreas is normal. No ductal dilatation. No pancreatic inflammation. Spleen: Normal spleen Adrenals/urinary tract: Adrenal glands normal. Kidneys enhance uniformly. No obstruction uropathy. Bladder normal Stomach/Bowel: Stomach, duodenum and small-bowel normal. Appendix partially visualized and fills with oral contrast. No inflammation identified. The ascending, transverse and descending colon normal. Delayed delayed rectal contrast imaging demonstrates normal rectosigmoid colon. Vascular/Lymphatic: Abdominal aorta is normal caliber. No periportal or retroperitoneal adenopathy. No pelvic adenopathy. Reproductive: Uterus and ovaries appear normal. IUD in expected location. Other: Interval decrease in volume of the free fluid in the pelvis seen on comparison CT. Trace remaining fluid remains. There is thin rim of enhancement along the residual fluid (image 69/3). Musculoskeletal: No aggressive osseous lesion IMPRESSION: 1. Interval decrease in in fluid  within the pelvis. Thin rim of enhancement remains along the peritoneal surface of the fluid collection. Findings most consistent resolving peritonitis. 2. No clear source of infection identified. Ovaries appear normal. Appendix normal. 3. Rectosigmoid colon normal on delayed rectal contrast imaging. Electronically Signed   By: Genevive Bi M.D.   On: 08/13/2023 21:03   Reassured by resolving rim enhancing complex fluid collection in the central pelvis.  No new concerning findings. Results discussed with patient over the phone, she was reassured. She tolerated some of her dinner okay, no new symptoms. She is agreeable to go home in the morning, will complete a 14 day course of antibiotics.   Results and plan also discussed with [name] Lofts, RN who is her current nurse for this shift.  Will continue routine care.   Jaynie Collins, MD, FACOG Obstetrician & Gynecologist, [facility] for [employer], [facility]

## 2023-08-13 NOTE — Progress Notes (Signed)
 Patient returned from procedure upset and crying D/T patient stating the procedure described by the provider, they would be using a suppository. Instead a fecal tube and contrast was ran into patient's rectum. Patient's husband seems furious and is demanding MD at bedside. Anyanwu was made aware and asked to come to bedside, but physician is currently is in sx, and will come to bedside once sx is completed.

## 2023-08-13 NOTE — Plan of Care (Signed)

## 2023-08-13 NOTE — Progress Notes (Signed)
 Gynecology Progress Note  Admission Date: 08/10/2023 Current Date: 08/13/2023 2:21 PM  [name] [name] is a 38 y.o. [id number] HD#4 admitted for abdominal pain, possible PID with fluid collection in the pelvis.   History complicated by: Patient Active Problem List   Diagnosis Date Noted   PID (acute pelvic inflammatory disease) 08/10/2023   Constipation 10/29/2012   ROS and patient/family/surgical history, located on admission H&P note dated 08/10/2023, have been reviewed, and there are no changes except as noted below Yesterday/Overnight Events:  None  Subjective:  Doesn't really feel different this morning. Pain is stable or at least a little better controlled with medications. She required IV dilaudid 1mg  this morning. She does not have an appetite and feels nauseated when she tries to eat. No vomiting. Has been OOB to bathroom.    Denies chills, vaginal discharge, or vaginal bleeding.   Objective:   Vitals:   08/12/23 2033 08/13/23 0120 08/13/23 0446 08/13/23 0847  BP: 135/82 136/88 139/88 139/86  Pulse: 84 86 92 86  Resp: 18 17 17 17   Temp: 97.7 F (36.5 C) 97.6 F (36.4 C) 97.9 F (36.6 C) 97.6 F (36.4 C)  TempSrc:  Oral  Oral  SpO2: 100% 100% 100% 100%  Weight:      Height:       Temp:  [97.6 F (36.4 C)-98.2 F (36.8 C)] 97.6 F (36.4 C) (04/09 0847) Pulse Rate:  [84-92] 86 (04/09 0847) Resp:  [16-18] 17 (04/09 0847) BP: (135-139)/(82-97) 139/86 (04/09 0847) SpO2:  [100 %] 100 % (04/09 0847) No intake/output data recorded. No intake/output data recorded. No intake or output data in the 24 hours ending 08/13/23 1421    Current Vital Signs 24h Vital Sign Ranges  T 97.6 F (36.4 C) Temp  Avg: 97.8 F (36.6 C)  Min: 97.6 F (36.4 C)  Max: 98.2 F (36.8 C)  BP 139/86 BP  Min: 135/82  Max: 139/86  HR 86 Pulse  Avg: 88  Min: 84  Max: 92  RR 17 Resp  Avg: 17  Min: 16  Max: 18  SaO2 100 % Room Air SpO2  Avg: 100 %  Min: 100 %  Max: 100 %       24 Hour I/O  Current Shift I/O  Time Ins Outs No intake/output data recorded. No intake/output data recorded.   Patient Vitals for the past 12 hrs:  BP Temp Temp src Pulse Resp SpO2  08/13/23 0847 139/86 97.6 F (36.4 C) Oral 86 17 100 %  08/13/23 0446 139/88 97.9 F (36.6 C) -- 92 17 100 %   Patient Vitals for the past 24 hrs:  BP Temp Temp src Pulse Resp SpO2  08/13/23 0847 139/86 97.6 F (36.4 C) Oral 86 17 100 %  08/13/23 0446 139/88 97.9 F (36.6 C) -- 92 17 100 %  08/13/23 0120 136/88 97.6 F (36.4 C) Oral 86 17 100 %  08/12/23 2033 135/82 97.7 F (36.5 C) -- 84 18 100 %  08/12/23 1757 (!) 138/97 98.2 F (36.8 C) Oral 92 16 100 %    Physical exam: General appearance: resting comfortably in bed, alert & oriented Abdomen: soft, mild upper abdominal tenderness, no rebound or guarding- recently received pain medication Lungs: normal effort Heart: mildly tachycardia noted Extremities: no lower extremity edema  Medications Current Facility-Administered Medications  Medication Dose Route Frequency Provider Last Rate Last Admin   acetaminophen (TYLENOL) tablet 1,000 mg  1,000 mg Oral Q6H Lennart Pall, MD  1,000 mg at 08/13/23 0502   alum & mag hydroxide-simeth (MAALOX/MYLANTA) 200-200-20 MG/5ML suspension 30 mL  30 mL Oral Q6H PRN Lennart Pall, MD   30 mL at 08/13/23 0553   cefTRIAXone (ROCEPHIN) 1 g in sodium chloride 0.9 % 100 mL IVPB  1 g Intravenous Daily Conan Bowens, MD 200 mL/hr at 08/13/23 0859 1 g at 08/13/23 0859   doxycycline (VIBRA-TABS) tablet 100 mg  100 mg Oral Q12H Conan Bowens, MD   100 mg at 08/13/23 6578   FLUoxetine (PROZAC) capsule 30 mg  30 mg Oral Daily Conan Bowens, MD   30 mg at 08/13/23 0913   HYDROmorphone (DILAUDID) injection 0.2-0.5 mg  0.2-0.5 mg Intravenous Q3H PRN Lennart Pall, MD       ibuprofen (ADVIL) tablet 600 mg  600 mg Oral Q6H Harvie Bridge C, MD   600 mg at 08/13/23 1239   iohexol (OMNIPAQUE) 9 MG/ML oral solution  500 mL  500 mL Oral Q1H Harvie Bridge C, MD   500 mL at 08/13/23 1352   metoCLOPramide (REGLAN) injection 10 mg  10 mg Intravenous Q6H PRN Lennart Pall, MD       metoCLOPramide (REGLAN) tablet 10 mg  10 mg Oral Q6H PRN Lennart Pall, MD       metroNIDAZOLE (FLAGYL) tablet 500 mg  500 mg Oral Q12H Conan Bowens, MD   500 mg at 08/13/23 0852   ondansetron (ZOFRAN) tablet 4 mg  4 mg Oral Q6H PRN Conan Bowens, MD       Or   ondansetron [facility]) injection 4 mg  4 mg Intravenous Q6H PRN Conan Bowens, MD   4 mg at 08/13/23 4696   Oral care mouth rinse  15 mL Mouth Rinse PRN Conan Bowens, MD       oxyCODONE (Oxy IR/ROXICODONE) immediate release tablet 5-10 mg  5-10 mg Oral Q4H PRN Lennart Pall, MD   10 mg at 08/13/23 2952   pantoprazole (PROTONIX) EC tablet 20 mg  20 mg Oral Daily Conan Bowens, MD   20 mg at 08/13/23 8413   polyethylene glycol (MIRALAX / GLYCOLAX) packet 17 g  17 g Oral Daily PRN Conan Bowens, MD       prenatal multivitamin tablet 1 tablet  1 tablet Oral Q1200 Conan Bowens, MD   1 tablet at 08/13/23 1239    Labs  Recent Labs  Lab 08/10/23 0826 08/11/23 0743 08/12/23 0654  WBC 7.7 8.3 7.8  HGB 12.6 10.1* 10.4*  HCT 38.5 31.6* 31.4*  PLT 286 235 236    Recent Labs  Lab 08/10/23 1557 08/11/23 0743 08/12/23 0654  NA 137 134* 134*  K 3.4* 3.2* 3.5  CL 105 103 99  CO2 23 22 23   BUN 12 12 7   CREATININE 0.88 0.76 0.81  CALCIUM 8.6* 8.4* 8.8*  PROT 6.2* 5.7* 5.9*  BILITOT 0.7 0.6 0.7  ALKPHOS 43 46 55  ALT 14 14 13   AST 18 16 18   GLUCOSE 109* 96 102*   Radiology No new studies  Assessment & Plan:  PID - Remains afebrile without leukocytosis and tachycardia improving however her pain & nausea have not significantly changed - Given some diagnostic uncertainty her imaging was reviewed with radiology. Plan to repeat scan with PO/IV contrast AND rectal contrast to better assess for GI vs GYN pathology - Continue CTX/doxy/flagyl -  GC/CT negative  2. Routine - continue daily labs  and replete lytes prn - advance to regular diet - encouraged ambulation  *Dispo: continue inpatient hospital treatment with IV antibiotics and pain medication, f/u CT for further dispo planning  Code Status: Full Code VTE PPX: SCDs, add lovenox pending scan results  Harvie Bridge, MD Obstetrician & Gynecologist, [facility] for [facility], [facility]

## 2023-08-14 ENCOUNTER — Other Ambulatory Visit (HOSPITAL_COMMUNITY): Payer: Self-pay

## 2023-08-14 MED ORDER — METRONIDAZOLE 500 MG PO TABS
500.0000 mg | ORAL_TABLET | Freq: Two times a day (BID) | ORAL | 0 refills | Status: DC
  Filled 2023-08-14: qty 20, 10d supply, fill #0

## 2023-08-14 MED ORDER — DOXYCYCLINE HYCLATE 100 MG PO TABS
100.0000 mg | ORAL_TABLET | Freq: Two times a day (BID) | ORAL | 0 refills | Status: DC
  Filled 2023-08-14: qty 20, 10d supply, fill #0

## 2023-08-14 NOTE — Plan of Care (Signed)

## 2023-08-14 NOTE — Plan of Care (Signed)

## 2023-08-14 NOTE — Discharge Summary (Signed)
 Physician Discharge Summary  Patient ID: AVYN ADEN MRN: [id number] DOB/AGE: Sep 15, 1985 38 y.o.  Admit date: 08/10/2023 Discharge date: 08/14/2023  Admission Diagnoses:Principal Problem:   PID (acute pelvic inflammatory disease)  Discharge Diagnoses:  Principal Problem:   PID (acute pelvic inflammatory disease)   Discharged Condition: good  Hospital Course: [name] [name] is a 38 y.o. [phone] presenting for severe abdominal pain starting last night at 8 pm. Started suddenly, rates 8/10 when not moving, 10/10 with moving. Reports it as "gnawing ache".  Never had pain like this before. Denies fever, chills, no issues with eating/drinking. Not constipated.    No significant gyn history, reports normal pap ~5 years ago, no history of abnormal pap, colpo. Has IUD in place x 5 years and has never had an issue with it. Two prior c-sections at term, no other abdominal surgery. No history of STIs. CT scan showed possible pelvic mass that measured 4 cm but was not deemed candidate for IR drain. F/u scan after 3 days showed partial resolution. Her sx and exam improved and she was tolerating oral intake at discharge. Consults:  radiology, IR  Significant Diagnostic Studies: labs: CBC    Component Value Date/Time   WBC 7.8 08/12/2023 0654   RBC 3.52 (L) 08/12/2023 0654   HGB 10.4 (L) 08/12/2023 0654   HGB 13.0 10/07/2012 0000   HCT 31.4 (L) 08/12/2023 0654   HCT 40 10/07/2012 0000   PLT 236 08/12/2023 0654   MCV 89.2 08/12/2023 0654   MCH 29.5 08/12/2023 0654   MCHC 33.1 08/12/2023 0654   RDW 13.0 08/12/2023 0654   LYMPHSABS 0.2 (L) 08/10/2023 0826   MONOABS 0.4 08/10/2023 0826   EOSABS 0.0 08/10/2023 0826   BASOSABS 0.0 08/10/2023 0826    and radiology: CT scan: rim enhancing pelvic mass 4 cm, decreased to 2 cm on f/u  Treatments: antibiotics: ceftriaxone, metronidazole, and doxycycline  Discharge Exam: Blood pressure (!) 145/96, pulse 79, temperature 98.3 F (36.8 C), resp.  rate 16, height 5' (1.524 m), weight 59 kg, last menstrual period 08/10/2023, SpO2 100%. General appearance: alert, cooperative, and no distress Resp: effort normal GI: soft, non-tender; bowel sounds normal; no masses,  no organomegaly  Disposition: Discharge disposition: 01-Home or Self Care       Discharge Instructions     Discharge patient   Complete by: As directed    Discharge disposition: 01-Home or Self Care   Discharge patient date: 08/14/2023      Allergies as of 08/14/2023       Reactions   Bee Venom Swelling   Penicillins Other (See Comments)   REACTION: Unknown, childhood reaction patient unsure         Medication List     TAKE these medications    albuterol 108 (90 Base) MCG/ACT inhaler Commonly known as: VENTOLIN HFA Inhale 2 puffs into the lungs every 4 (four) hours as needed for wheezing or shortness of breath.   doxycycline 100 MG tablet Commonly known as: VIBRA-TABS Take 1 tablet (100 mg total) by mouth every 12 (twelve) hours.   DULoxetine 60 MG capsule Commonly known as: CYMBALTA Take 60 mg by mouth at bedtime.   FLUoxetine 20 MG tablet Commonly known as: PROZAC Take 60 mg by mouth at bedtime.   fluticasone 110 MCG/ACT inhaler Commonly known as: FLOVENT HFA Inhale 2 puffs into the lungs daily as needed (SOB, wheezing).   ibuprofen 200 MG tablet Commonly known as: ADVIL Take 200-400 mg by mouth every 8 (  eight) hours as needed for fever, headache, mild pain (pain score 1-3) or moderate pain (pain score 4-6).   levonorgestrel 20 MCG/24HR IUD Commonly known as: MIRENA 1 each by Intrauterine route once.   metroNIDAZOLE 500 MG tablet Commonly known as: FLAGYL Take 1 tablet (500 mg total) by mouth 2 (two) times daily.   pantoprazole 20 MG tablet Commonly known as: PROTONIX Take 20 mg by mouth daily.        Follow-up Information     [facility] for [facility] Follow up in 2 week(s).   Specialty:  Obstetrics and Gynecology Why: s/p PID Contact information: [address] [place] [address] [phone]                Signed: Scheryl Darter 08/14/2023, 9:35 AM

## 2023-08-14 NOTE — Progress Notes (Signed)
 Discharge instructions reviewed with pt.  Copy of instructions given to pt. [facility] has filled pt's scripts and will be picked up on the way to the discharge lounge.  Pt will be d/c'd via wheelchair with belongings and will be escorted by staff to the discharge lounge. Pt states her ride will be coming at 2pm.   Jadore Veals,RN SWOT

## 2024-01-04 ENCOUNTER — Emergency Department (HOSPITAL_COMMUNITY)

## 2024-01-04 ENCOUNTER — Emergency Department (HOSPITAL_COMMUNITY): Admission: EM | Admit: 2024-01-04 | Discharge: 2024-01-04 | Disposition: A | Discharge status: Home / Self Care

## 2024-01-04 ENCOUNTER — Other Ambulatory Visit: Payer: Self-pay

## 2024-01-04 DIAGNOSIS — R202 Paresthesia of skin: Secondary | ICD-10-CM | POA: Diagnosis not present

## 2024-01-04 DIAGNOSIS — R200 Anesthesia of skin: Secondary | ICD-10-CM | POA: Diagnosis not present

## 2024-01-04 DIAGNOSIS — R519 Headache, unspecified: Secondary | ICD-10-CM | POA: Diagnosis present

## 2024-01-04 LAB — CBC
HCT: 35.7 % — ABNORMAL LOW (ref 36.0–46.0)
Hemoglobin: 11.7 g/dL — ABNORMAL LOW (ref 12.0–15.0)
MCH: 29.8 pg (ref 26.0–34.0)
MCHC: 32.8 g/dL (ref 30.0–36.0)
MCV: 91.1 fL (ref 80.0–100.0)
Platelets: 299 K/uL (ref 150–400)
RBC: 3.92 MIL/uL (ref 3.87–5.11)
RDW: 12.9 % (ref 11.5–15.5)
WBC: 7 K/uL (ref 4.0–10.5)
nRBC: 0 % (ref 0.0–0.2)

## 2024-01-04 LAB — BASIC METABOLIC PANEL WITH GFR
Anion gap: 12 (ref 5–15)
BUN: 12 mg/dL (ref 6–20)
CO2: 25 mmol/L (ref 22–32)
Calcium: 8.9 mg/dL (ref 8.9–10.3)
Chloride: 100 mmol/L (ref 98–111)
Creatinine, Ser: 0.72 mg/dL (ref 0.44–1.00)
GFR, Estimated: >60 mL/min (ref >60)
Glucose, Bld: 104 mg/dL — ABNORMAL HIGH (ref 70–99)
Potassium: 3.4 mmol/L — ABNORMAL LOW (ref 3.5–5.1)
Sodium: 137 mmol/L (ref 135–145)

## 2024-01-04 LAB — HCG, SERUM, QUALITATIVE: Preg, Serum: NEGATIVE

## 2024-01-04 MED ORDER — DEXAMETHASONE SODIUM PHOSPHATE 10 MG/ML IJ SOLN
10.0000 mg | Freq: Once | INTRAMUSCULAR | Status: AC
  Administered 2024-01-04: 10 mg via INTRAVENOUS
  Filled 2024-01-04: qty 1

## 2024-01-04 MED ORDER — GADOBUTROL 1 MMOL/ML IV SOLN
6.0000 mL | Freq: Once | INTRAVENOUS | Status: AC | PRN
  Administered 2024-01-04: 6 mL via INTRAVENOUS

## 2024-01-04 MED ORDER — DIPHENHYDRAMINE HCL 50 MG/ML IJ SOLN
25.0000 mg | Freq: Once | INTRAMUSCULAR | Status: AC
  Administered 2024-01-04: 25 mg via INTRAVENOUS
  Filled 2024-01-04: qty 1

## 2024-01-04 MED ORDER — LACTATED RINGERS IV BOLUS
1000.0000 mL | Freq: Once | INTRAVENOUS | Status: AC
  Administered 2024-01-04: 1000 mL via INTRAVENOUS

## 2024-01-04 MED ORDER — PROCHLORPERAZINE EDISYLATE 10 MG/2ML IJ SOLN
10.0000 mg | Freq: Once | INTRAMUSCULAR | Status: AC
  Administered 2024-01-04: 10 mg via INTRAVENOUS
  Filled 2024-01-04: qty 2

## 2024-01-04 MED ORDER — LACTATED RINGERS IV BOLUS: 1000.0000 mL | Freq: Once | INTRAVENOUS | Status: DC

## 2024-01-04 NOTE — ED Triage Notes (Signed)
 Pt c/o L side facial, arm, leg, and foot numbness x 2 weeks. Denies pain. States it started with a HA x weeks ago and she got dizzy and got like this with the numbess x 2 weeks

## 2024-01-04 NOTE — Discharge Instructions (Signed)
 Your workup here today was reassuring.  Please take Tylenol  or ibuprofen  as needed at home for headache if this were to recur.  Please follow-up with your doctor.  If you do not have a doctor please call the referral line at the number provided.  Return to the ER for worsening symptoms.

## 2024-01-04 NOTE — ED Notes (Signed)
 ED Provider at bedside.

## 2024-01-04 NOTE — ED Notes (Signed)
 Patient transported to MRI

## 2024-01-04 NOTE — ED Provider Notes (Signed)
 [place] EMERGENCY DEPARTMENT AT [facility] Provider Note   CSN: [id number] Arrival date & time: 01/04/24  8176     Patient presents with: Numbness   [name] [name] is a 38 y.o. female.   38 year old female with past medical history of GERD and anxiety presenting to the emergency department today with headache as well as numbness in her left face, left arm, and left leg.  The patient states that this been going now for 2 weeks.  She states that she has been having a moderate global headache since then.  She denies any fevers or chills.  She denies any recent injuries.  She came to the emergency department today due to ongoing symptoms.  She states that she does have a history of migraine headaches but has never had neurological symptoms with them before in the past.        Prior to Admission medications   Medication Sig Start Date End Date Taking? Authorizing Provider  albuterol  (PROVENTIL  HFA;VENTOLIN  HFA) 108 (90 Base) MCG/ACT inhaler Inhale 2 puffs into the lungs every 4 (four) hours as needed for wheezing or shortness of breath. 08/11/17  Yes Joyice Sauer, DO  FLUoxetine  (PROZAC ) 20 MG tablet Take 60 mg by mouth at bedtime. 05/17/20  Yes [provider]  hydrOXYzine (ATARAX) 25 MG tablet Take 25 mg by mouth at bedtime as needed. 10/17/23  Yes [provider]  pantoprazole  (PROTONIX ) 40 MG tablet Take 40 mg by mouth daily. 12/04/23  Yes [provider]  DULoxetine (CYMBALTA) 60 MG capsule Take 60 mg by mouth at bedtime.    [provider]  fluticasone (FLOVENT HFA) 110 MCG/ACT inhaler Inhale 2 puffs into the lungs daily as needed (SOB, wheezing). 05/05/23   [provider]  ibuprofen  (ADVIL ) 200 MG tablet Take 200-400 mg by mouth every 8 (eight) hours as needed for fever, headache, mild pain (pain score 1-3) or moderate pain (pain score 4-6).    [provider]  levonorgestrel (MIRENA) 20 MCG/24HR IUD 1 each by  Intrauterine route once.    [provider]    Allergies: Bee venom and Penicillins    Review of Systems  Neurological:  Positive for numbness and headaches. Negative for weakness.  All other systems reviewed and are negative.   Updated Vital Signs BP 114/73   Pulse 88   Temp 98.5 F (36.9 C) (Oral)   Resp 18   SpO2 97%   Physical Exam Vitals and nursing note reviewed.   Gen: NAD Eyes: PERRL, EOMI HEENT: no oropharyngeal swelling Neck: trachea midline Resp: clear to auscultation bilaterally Card: RRR, no murmurs, rubs, or gallops Abd: nontender, nondistended Extremities: no calf tenderness, no edema Vascular: 2+ radial pulses bilaterally, 2+ DP pulses bilaterally Neuro: Cranial nerves intact, the patient has equal strength throughout the bilateral upper and lower extremities, reports diminished sensation over the left arm and left leg Skin: no rashes Psyc: acting appropriately   (all labs ordered are listed, but only abnormal results are displayed) Labs Reviewed  CBC - Abnormal; Notable for the following components:      Result Value   Hemoglobin 11.7 (*)    HCT 35.7 (*)    All other components within normal limits  BASIC METABOLIC PANEL WITH GFR - Abnormal; Notable for the following components:   Potassium 3.4 (*)    Glucose, Bld 104 (*)    All other components within normal limits  HCG, SERUM, QUALITATIVE    EKG: None  Radiology: MR Brain W and Wo Contrast Result Date: 01/04/2024 CLINICAL DATA:  Initial evaluation for acute neuro deficit, stroke suspected. EXAM: MRI HEAD WITHOUT AND WITH CONTRAST TECHNIQUE: Multiplanar, multiecho pulse sequences of the brain and surrounding structures were obtained without and with intravenous contrast. CONTRAST:  6mL GADAVIST  GADOBUTROL  1 MMOL/ML IV SOLN COMPARISON:  Comparison made with prior brain MRI from 06/26/2020. FINDINGS: Brain: Cerebral volume within normal limits. No focal parenchymal signal abnormality or  significant cerebral white matter disease. No abnormal foci of restricted diffusion to suggest acute or subacute ischemia. Gray-white matter differentiation maintained. No areas of chronic cortical infarction. No acute or chronic intracranial blood products. No mass lesion, midline shift or mass effect. No hydrocephalus or extra-axial fluid collection. Pituitary gland is prominent with convex border superiorly, but no visible discrete lesion by MRI. Appearance is similar. Suprasellar region within normal limits. No abnormal enhancement. Vascular: Major intracranial vascular flow voids are maintained. Skull and upper cervical spine: Cerebellar tonsils are low lying extending up to 8 mm below the foramen magnum, consistent with mild Chiari 1 malformation. Visualized upper cervical spinal cord within normal limits. Bone marrow signal intensity overall within normal limits. No scalp soft tissue abnormality. Sinuses/Orbits: Globes orbital soft tissues within normal limits. Paranasal sinuses are largely clear. No significant mastoid effusion. Other: None. IMPRESSION: 1. No acute intracranial abnormality. 2. 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 3. Prominent pituitary gland with convex border superiorly, but no visible discrete lesion by MRI. Appearance is similar to previous. Correlation with pituitary function tests suggested. Electronically Signed   By: Morene Hoard M.D.   On: 01/04/2024 20:24     Procedures   Medications Ordered in the ED  lactated ringers  bolus 1,000 mL (0 mLs Intravenous Hold 01/04/24 2027)  dexamethasone  (DECADRON ) injection 10 mg (has no administration in time range)  lactated ringers  bolus 1,000 mL (1,000 mLs Intravenous New Bag/Given 01/04/24 1953)  prochlorperazine  (COMPAZINE ) injection 10 mg (10 mg Intravenous Given 01/04/24 1954)  diphenhydrAMINE  (BENADRYL ) injection 25 mg (25 mg Intravenous Given 01/04/24 1953)   gadobutrol  (GADAVIST ) 1 MMOL/ML injection 6 mL (6 mLs Intravenous Contrast Given 01/04/24 1934)                                    Medical Decision Making 38 year old female with past medical history of migraine headaches, GERD, and anxiety presenting to the emergency department today with left-sided numbness and headache that has been going on now for the past 2 weeks.  I will further evaluate the patient here with basic labs as well as a pregnancy test to evaluate for anemia or electrolyte abnormalities.  Will obtain an MRI with and without contrast here for further evaluation.  If we do not have MRI we will proceed with CT scan as they are of limited availability here.  I will give patient Compazine  and Benadryl  in the event this is due to complex migraine and will give her IV fluids.  She does not have any meningismus here on exam and does seem to be mentating clearly so suspicion for meningitis or encephalitis is low at this time.  I will reevaluate for ultimate disposition.  The patient's labs here are reassuring.  Heart rate has improved here with IV fluids.  The patient's symptoms resolved with the medications here.  Suspect this is likely due to complex migraine  as her MRI shows a stable Chiari malformation and no other acute findings.  It did show chronically enlarged pituitary gland.  The patient is discharged with return precautions.  Amount and/or Complexity of Data Reviewed Labs: ordered. Radiology: ordered.  Risk Prescription drug management.        Final diagnoses:  Paresthesia  Nonintractable headache, unspecified chronicity pattern, unspecified headache type    ED Discharge Orders     None          Ula Prentice SAUNDERS, MD 01/04/24 2034
